# Patient Record
Sex: Female | Born: 1975 | Race: Black or African American | Hispanic: No | Marital: Single | Smoking: Never smoker
Health system: Southern US, Community
[De-identification: ages and names within clinical notes are randomized; demographics above are authoritative.]

---

## 2015-09-26 ENCOUNTER — Encounter (HOSPITAL_BASED_OUTPATIENT_CLINIC_OR_DEPARTMENT_OTHER): Payer: Self-pay | Admitting: Emergency Medicine

## 2015-09-26 ENCOUNTER — Emergency Department (HOSPITAL_BASED_OUTPATIENT_CLINIC_OR_DEPARTMENT_OTHER): Payer: Self-pay

## 2015-09-26 DIAGNOSIS — S92332A Displaced fracture of third metatarsal bone, left foot, initial encounter for closed fracture: Secondary | ICD-10-CM | POA: Insufficient documentation

## 2015-09-26 DIAGNOSIS — Y998 Other external cause status: Secondary | ICD-10-CM | POA: Insufficient documentation

## 2015-09-26 DIAGNOSIS — Y9289 Other specified places as the place of occurrence of the external cause: Secondary | ICD-10-CM | POA: Insufficient documentation

## 2015-09-26 DIAGNOSIS — W108XXA Fall (on) (from) other stairs and steps, initial encounter: Secondary | ICD-10-CM | POA: Insufficient documentation

## 2015-09-26 DIAGNOSIS — Y9389 Activity, other specified: Secondary | ICD-10-CM | POA: Insufficient documentation

## 2015-09-26 NOTE — ED Notes (Signed)
Pt sts she twisted her left foot on a "stepdown" while going to her car.  Pain and swelling to top of left foot.

## 2015-09-27 ENCOUNTER — Emergency Department (HOSPITAL_BASED_OUTPATIENT_CLINIC_OR_DEPARTMENT_OTHER)
Admission: EM | Admit: 2015-09-27 | Discharge: 2015-09-27 | Disposition: A | Discharge status: Home / Self Care | Payer: Self-pay | Attending: Emergency Medicine | Admitting: Emergency Medicine

## 2015-09-27 DIAGNOSIS — S92332A Displaced fracture of third metatarsal bone, left foot, initial encounter for closed fracture: Secondary | ICD-10-CM

## 2015-09-27 DIAGNOSIS — W108XXA Fall (on) (from) other stairs and steps, initial encounter: Secondary | ICD-10-CM

## 2015-09-27 MED ORDER — HYDROCODONE-ACETAMINOPHEN 5-325 MG PO TABS: 1.0000 | ORAL_TABLET | Freq: Four times a day (QID) | ORAL | Status: DC | PRN

## 2015-09-27 MED ORDER — IBUPROFEN 800 MG PO TABS
800.0000 mg | ORAL_TABLET | Freq: Once | ORAL | Status: AC
  Administered 2015-09-27: 800 mg via ORAL
  Filled 2015-09-27: qty 1

## 2015-09-27 MED ORDER — HYDROCODONE-ACETAMINOPHEN 5-325 MG PO TABS
1.0000 | ORAL_TABLET | Freq: Once | ORAL | Status: AC
  Administered 2015-09-27: 1 via ORAL
  Filled 2015-09-27: qty 1

## 2015-09-27 NOTE — ED Provider Notes (Signed)
CSN: [id number]     Arrival date & time 09/26/15  2327 History   First MD Initiated Contact with Patient 09/27/15 0246     Chief Complaint  Patient presents with  . Foot Injury     (Consider location/radiation/quality/duration/timing/severity/associated sxs/prior Treatment) HPI  This is a 40 year old female who missed a step coming out of her house yesterday evening about 10 PM. In the process she twisted her left foot and is now having moderate pain in the mid dorsal left foot with some associated swelling. She is unable to bear weight due to pain. Pain is worse with palpation, movement or attempted weightbearing. She denies left ankle pain or other injury.  History reviewed. No pertinent past medical history. History reviewed. No pertinent past surgical history. No family history on file. Social History  Substance Use Topics  . Smoking status: Never Smoker   . Smokeless tobacco: None  . Alcohol Use: No   OB History    No data available     Review of Systems  All other systems reviewed and are negative.   Allergies  Review of patient's allergies indicates no known allergies.  Home Medications   Prior to Admission medications   Not on File   BP 115/80 mmHg  Pulse 82  Temp(Src) 97.7 F (36.5 C) (Oral)  Resp 16  Ht  (1.676 m)  Wt 210 lb (95.255 kg)  BMI 33.91 kg/m2  SpO2 100%  LMP 08/04/2015 (Approximate)   Physical Exam  General: Well-developed, well-nourished female in no acute distress; appearance consistent with age of record HENT: normocephalic; atraumatic Eyes: Normal appearance Neck: supple Heart: regular rate and rhythm Lungs: clear to auscultation bilaterally Abdomen: soft; nondistended Extremities: No deformity; pulses normal; tenderness and mild swelling over mid dorsal left foot; no left ankle tenderness; no proximal left fibular tenderness Neurologic: Awake, alert and oriented; motor function intact in all extremities and symmetric; no facial  droop Skin: Warm and dry Psychiatric: Normal mood and affect    ED Course  Procedures (including critical care time)   MDM  Nursing notes and vitals signs, including pulse oximetry, reviewed.  Summary of this visit's results, reviewed by myself:  Imaging Studies: Dg Foot Complete Left  09/27/2015  CLINICAL DATA:  Left foot pain after fall. Twisting injury today. Pain and swelling about dorsum of foot. EXAM: LEFT FOOT - COMPLETE 3+ VIEW COMPARISON:  None. FINDINGS: Questionable transverse fracture of the proximal third metatarsal, age indeterminate. The alignment is maintained. No evidence of additional fracture. There is dorsal soft tissue edema. IMPRESSION: Question of transverse third proximal metatarsal fracture, age indeterminate and not well characterized radiographically. CT could be considered for further evaluation. Electronically Signed   By: Rubye Oaks M.D.   On: 09/27/2015 00:34       Paula Libra, MD 09/27/15 [phone]

## 2015-09-27 NOTE — ED Notes (Signed)
Ice to left foot

## 2015-09-27 NOTE — ED Notes (Signed)
Pt tolerated well. Pt did not have any questions regarding shoe or crutches.

## 2015-09-28 ENCOUNTER — Encounter: Payer: Self-pay | Admitting: Family Medicine

## 2015-09-28 ENCOUNTER — Ambulatory Visit (INDEPENDENT_AMBULATORY_CARE_PROVIDER_SITE_OTHER): Payer: Self-pay | Admitting: Family Medicine

## 2015-09-28 VITALS — BP 119/75 | HR 79 | Ht 66.0 in

## 2015-09-28 DIAGNOSIS — S92332A Displaced fracture of third metatarsal bone, left foot, initial encounter for closed fracture: Secondary | ICD-10-CM

## 2015-09-28 MED ORDER — HYDROCODONE-ACETAMINOPHEN 5-325 MG PO TABS: 1.0000 | ORAL_TABLET | Freq: Four times a day (QID) | ORAL | Status: DC | PRN

## 2015-09-28 MED FILL — HYDROCODON-APAP 5-325: 5-325 | 15 days supply | Qty: 60 | Fill #0

## 2015-09-28 NOTE — Patient Instructions (Signed)
You have a 3rd metatarsal fracture. Use the cam walker or postop shoe when up and walking around. Icing 15 minutes at a time 3-4 times a day. Elevate above your heart as much as possible. Norco as needed for severe pain. Ok to take ibuprofen or aleve in addition to this. Follow up with me in 2 weeks - we will repeat your x-rays at this time.

## 2015-10-01 DIAGNOSIS — S92332A Displaced fracture of third metatarsal bone, left foot, initial encounter for closed fracture: Secondary | ICD-10-CM | POA: Insufficient documentation

## 2015-10-01 NOTE — Progress Notes (Signed)
PCP: No PCP Per Patient  Subjective:   HPI: Patient is a 40 y.o. female here for left foot injury.  Patient reports on 2/25 she fell down some steps and twisted her foot. Difficulty weight bearing. Pain is sharp, associated with swelling - 9/10 level of pain dorsal left foot. Using crutches and not weight bearing. Using a postop shoe. No prior injuries. Has been icing, elevating. No skin changes, fever, other complaints.  No past medical history on file.  No current outpatient prescriptions on file prior to visit.   No current facility-administered medications on file prior to visit.    No past surgical history on file.  No Known Allergies  Social History   Social History  . Marital Status: Single    Spouse Name: N/A  . Number of Children: N/A  . Years of Education: N/A   Occupational History  . Not on file.   Social History Main Topics  . Smoking status: Never Smoker   . Smokeless tobacco: Not on file  . Alcohol Use: No  . Drug Use: No  . Sexual Activity: Yes    Birth Control/ Protection: None   Other Topics Concern  . Not on file   Social History Narrative    No family history on file.  BP 119/75 mmHg  Pulse 79  Ht  (1.676 m)  LMP 08/04/2015 (Approximate)  Review of Systems: See HPI above.    Objective:  Physical Exam:  Gen: NAD, comfortable in exam room  Left foot/ankle: Mod swelling, no bruising dorsal foot.  No other deformity. FROM ankle with 5/5 strength all directions. TTP greatest over 3rd metatarsal, less surrounding this. Negative ant drawer and talar tilt.   Negative syndesmotic compression. Positive metatarsal squeeze. Thompsons test negative. NV intact distally.  Right foot/ankle: FROM without pain.  MSK u/s left foot - cortical irregularity of 3rd metatarsal with edema overlying cortex.  No other abnormalities.    Assessment & Plan:  1. Left 3rd metatarsal fracture - independently reviewed radiographs and today's  ultrasound.  Consistent with transverse 3rd metatarsal fracture.  Expect 6-8 weeks to fully recover.  Icing, elevation, nsaids with norco as needed.  F/u in 2 weeks to repeat ultrasound.  Cam walker with crutches for support.

## 2015-10-01 NOTE — Assessment & Plan Note (Signed)
Left 3rd metatarsal fracture - independently reviewed radiographs and today's ultrasound.  Consistent with transverse 3rd metatarsal fracture.  Expect 6-8 weeks to fully recover.  Icing, elevation, nsaids with norco as needed.  F/u in 2 weeks to repeat ultrasound.  Cam walker with crutches for support.

## 2015-10-12 ENCOUNTER — Ambulatory Visit (INDEPENDENT_AMBULATORY_CARE_PROVIDER_SITE_OTHER): Payer: Self-pay | Admitting: Family Medicine

## 2015-10-12 ENCOUNTER — Ambulatory Visit (HOSPITAL_BASED_OUTPATIENT_CLINIC_OR_DEPARTMENT_OTHER)
Admission: RE | Admit: 2015-10-12 | Discharge: 2015-10-12 | Disposition: A | Discharge status: Home / Self Care | Payer: Self-pay | Source: Ambulatory Visit | Attending: Family Medicine | Admitting: Family Medicine

## 2015-10-12 VITALS — BP 116/80 | HR 90 | Ht 66.0 in | Wt 210.0 lb

## 2015-10-12 DIAGNOSIS — S92302D Fracture of unspecified metatarsal bone(s), left foot, subsequent encounter for fracture with routine healing: Secondary | ICD-10-CM

## 2015-10-12 DIAGNOSIS — S92332D Displaced fracture of third metatarsal bone, left foot, subsequent encounter for fracture with routine healing: Secondary | ICD-10-CM

## 2015-10-12 DIAGNOSIS — X58XXXD Exposure to other specified factors, subsequent encounter: Secondary | ICD-10-CM | POA: Insufficient documentation

## 2015-10-12 MED ORDER — HYDROCODONE-ACETAMINOPHEN 5-325 MG PO TABS: 1.0000 | ORAL_TABLET | Freq: Four times a day (QID) | ORAL | Status: DC | PRN

## 2015-10-12 NOTE — Patient Instructions (Addendum)
You have a 3rd metatarsal fracture. Use the cam walker or postop shoe. Use crutches and try not to put weight on this leg at all. Icing 15 minutes at a time 3-4 times a day. Elevate above your heart as much as possible. Norco as needed for severe pain. Ok to take ibuprofen or aleve in addition to this. Follow up with me in 2 weeks - we will repeat your x-rays at this time.

## 2015-10-14 ENCOUNTER — Encounter: Payer: Self-pay | Admitting: Family Medicine

## 2015-10-14 NOTE — Assessment & Plan Note (Signed)
Independently reviewed radiographs along with today's ultrasound.  On ultrasound only the 3rd metatarsal fracture is visible.  On radiographs she appears to have fractures at bases of 2nd and 4th metatarsals.  I advised patient at this point to continue wearing boot but switch to non weight bearing with concern for lis franc injury.  She has not yet obtained [facility] Coverage but has paperwork to do so. We will consider MRI once she has this and will follow her clinical healing in meantime.  Icing, norco, nsaids.  F/u in 2 weeks.

## 2015-10-14 NOTE — Progress Notes (Signed)
PCP: No PCP Per Patient  Subjective:   HPI: Patient is a 40 y.o. female here for left foot injury.  2/27: Patient reports on 2/25 she fell down some steps and twisted her foot. Difficulty weight bearing. Pain is sharp, associated with swelling - 9/10 level of pain dorsal left foot. Using crutches and not weight bearing. Using a postop shoe. No prior injuries. Has been icing, elevating. No skin changes, fever, other complaints.  3/13: Patient reports her pain level is down to 5/10, sharp. Still with swelling. Wearing cam walker - tried walking at times without the boot around the house with pain. Icing, elevating. No skin changes, fever.  No past medical history on file.  No current outpatient prescriptions on file prior to visit.   No current facility-administered medications on file prior to visit.    No past surgical history on file.  No Known Allergies  Social History   Social History  . Marital Status: Single    Spouse Name: N/A  . Number of Children: N/A  . Years of Education: N/A   Occupational History  . Not on file.   Social History Main Topics  . Smoking status: Never Smoker   . Smokeless tobacco: Not on file  . Alcohol Use: No  . Drug Use: No  . Sexual Activity: Yes    Birth Control/ Protection: None   Other Topics Concern  . Not on file   Social History Narrative    No family history on file.  BP 116/80 mmHg  Pulse 90  Ht 5\' 6"  (1.676 m)  Wt 210 lb (95.255 kg)  BMI 33.91 kg/m2  LMP 08/08/2015  Review of Systems: See HPI above.    Objective:  Physical Exam:  Gen: NAD, comfortable in exam room  Left foot/ankle: Mild swelling, no bruising dorsal foot.  No other deformity. FROM ankle with 5/5 strength all directions. TTP greatest over 3rd metatarsal, less surrounding this. Negative ant drawer and talar tilt.   Negative syndesmotic compression. Positive metatarsal squeeze. Thompsons test negative. NV intact distally.  Right  foot/ankle: FROM without pain.  MSK u/s left foot - cortical irregularity of 3rd metatarsal with edema overlying cortex.  No other abnormalities.    Assessment & Plan:  1. Metatarsal fractures - Independently reviewed radiographs along with today's ultrasound.  On ultrasound only the 3rd metatarsal fracture is visible.  On radiographs she appears to have fractures at bases of 2nd and 4th metatarsals.  I advised patient at this point to continue wearing boot but switch to non weight bearing with concern for lis franc injury.  She has not yet obtained [facility] Coverage but has paperwork to do so. We will consider MRI once she has this and will follow her clinical healing in meantime.  Icing, norco, nsaids.  F/u in 2 weeks.

## 2015-10-26 ENCOUNTER — Ambulatory Visit (HOSPITAL_BASED_OUTPATIENT_CLINIC_OR_DEPARTMENT_OTHER)
Admission: RE | Admit: 2015-10-26 | Discharge: 2015-10-26 | Disposition: A | Discharge status: Home / Self Care | Payer: Self-pay | Source: Ambulatory Visit | Attending: Family Medicine | Admitting: Family Medicine

## 2015-10-26 ENCOUNTER — Ambulatory Visit (INDEPENDENT_AMBULATORY_CARE_PROVIDER_SITE_OTHER): Payer: Self-pay | Admitting: Family Medicine

## 2015-10-26 ENCOUNTER — Encounter: Payer: Self-pay | Admitting: Family Medicine

## 2015-10-26 VITALS — BP 108/73 | HR 87 | Ht 66.0 in | Wt 200.0 lb

## 2015-10-26 DIAGNOSIS — M79672 Pain in left foot: Secondary | ICD-10-CM

## 2015-10-26 DIAGNOSIS — S92332D Displaced fracture of third metatarsal bone, left foot, subsequent encounter for fracture with routine healing: Secondary | ICD-10-CM

## 2015-10-26 NOTE — Patient Instructions (Signed)
Try not to put weight on this leg and wear the boot every time you're up and around. Can take this off to ice it, wash the area. Icing 15 minutes at a time 3-4 times a day. Elevate above your heart as much as possible. Norco as needed for severe pain. Ok to take ibuprofen or aleve in addition to this. Follow up with me in 2 weeks - we will repeat your x-rays at this time. If you're not improving and we do not see callus at this visit we need to do further imaging.

## 2015-10-26 NOTE — Progress Notes (Signed)
PCP: No PCP Per Patient  Subjective:   HPI: Patient is a 40 y.o. female here for left foot injury.  2/27: Patient reports on 2/25 she fell down some steps and twisted her foot. Difficulty weight bearing. Pain is sharp, associated with swelling - 9/10 level of pain dorsal left foot. Using crutches and not weight bearing. Using a postop shoe. No prior injuries. Has been icing, elevating. No skin changes, fever, other complaints.  3/13: Patient reports her pain level is down to 5/10, sharp. Still with swelling. Wearing cam walker - tried walking at times without the boot around the house with pain. Icing, elevating. No skin changes, fever.  3/27: Patient reports mild improvement from last visit. Pain level 4/10, sharp dorsal left foot. Has unfortunately been walking in boot - tried to walk once without the boot but was painful. Swelling improved. Has been icing, elevating. No skin changes, fever.  No past medical history on file.  Current Outpatient Prescriptions on File Prior to Visit  Medication Sig Dispense Refill  . HYDROcodone-acetaminophen (NORCO) 5-325 MG tablet Take 1 tablet by mouth every 6 (six) hours as needed (for pain). 60 tablet 0   No current facility-administered medications on file prior to visit.    No past surgical history on file.  No Known Allergies  Social History   Social History  . Marital Status: Single    Spouse Name: N/A  . Number of Children: N/A  . Years of Education: N/A   Occupational History  . Not on file.   Social History Main Topics  . Smoking status: Never Smoker   . Smokeless tobacco: Not on file  . Alcohol Use: No  . Drug Use: No  . Sexual Activity: Yes    Birth Control/ Protection: None   Other Topics Concern  . Not on file   Social History Narrative    No family history on file.  BP 108/73 mmHg  Pulse 87  Ht 5\' 6"  (1.676 m)  Wt 200 lb (90.719 kg)  BMI 32.30 kg/m2  LMP 08/08/2015  Review of  Systems: See HPI above.    Objective:  Physical Exam:  Gen: NAD, comfortable in exam room  Left foot/ankle: Mild swelling, no bruising dorsal foot.  No other deformity. FROM ankle with 5/5 strength all directions. TTP greatest over 3rd metatarsal and 4th metatarsal only.  No 2nd metatarsal, other tenderness. Negative ant drawer and talar tilt.   Negative syndesmotic compression. Thompsons test negative. NV intact distally.  Right foot/ankle: FROM without pain.    Assessment & Plan:  1. Metatarsal fractures - Independently reviewed today's radiographs - no callus formation yet though they appear to be healing and patient is clinically improving.  Stressed importance of being non weight bearing given concern for lis franc injury.  She does not yet have [facility] Coverage but states she has provided paperwork for this.  Continue non weight bearing, boot, crutches.  Icing, norco if needed.  F/u in 2 weeks to repeat x-rays.  Consider advanced imaging if not improving as expected at that point.

## 2015-10-26 NOTE — Assessment & Plan Note (Signed)
Independently reviewed today's radiographs - no callus formation yet though they appear to be healing and patient is clinically improving.  Stressed importance of being non weight bearing given concern for lis franc injury.  She does not yet have [facility] Coverage but states she has provided paperwork for this.  Continue non weight bearing, boot, crutches.  Icing, norco if needed.  F/u in 2 weeks to repeat x-rays.  Consider advanced imaging if not improving as expected at that point.

## 2015-11-09 ENCOUNTER — Ambulatory Visit: Payer: Self-pay | Admitting: Family Medicine

## 2015-11-10 ENCOUNTER — Ambulatory Visit: Payer: Self-pay | Admitting: Family Medicine

## 2015-12-22 ENCOUNTER — Emergency Department (HOSPITAL_BASED_OUTPATIENT_CLINIC_OR_DEPARTMENT_OTHER): Payer: Self-pay

## 2015-12-22 ENCOUNTER — Emergency Department (HOSPITAL_BASED_OUTPATIENT_CLINIC_OR_DEPARTMENT_OTHER)
Admission: EM | Admit: 2015-12-22 | Discharge: 2015-12-22 | Disposition: A | Discharge status: Home / Self Care | Payer: Self-pay | Attending: Emergency Medicine | Admitting: Emergency Medicine

## 2015-12-22 ENCOUNTER — Encounter (HOSPITAL_BASED_OUTPATIENT_CLINIC_OR_DEPARTMENT_OTHER): Payer: Self-pay | Admitting: *Deleted

## 2015-12-22 DIAGNOSIS — M79672 Pain in left foot: Secondary | ICD-10-CM | POA: Insufficient documentation

## 2015-12-22 NOTE — Discharge Instructions (Signed)
Ice and elevate affected area for relief of pain and swelling. Ibuprofen as needed for pain. If symptoms persist greater than 1 week, follow up with the orthopedic clinic listed. Return to ER for any new or worsening symptoms, any additional concerns.  COLD THERAPY DIRECTIONS:  Ice or gel packs can be used to reduce both pain and swelling. Ice is the most helpful within the first 24 to 48 hours after an injury or flareup from overusing a muscle or joint.  Ice is effective, has very few side effects, and is safe for most people to use.   If you expose your skin to cold temperatures for too long or without the proper protection, you can damage your skin or nerves. Watch for signs of skin damage due to cold.   HOME CARE INSTRUCTIONS  Follow these tips to use ice and cold packs safely.  Place a dry or damp towel between the ice and skin. A damp towel will cool the skin more quickly, so you may need to shorten the time that the ice is used.  For a more rapid response, add gentle compression to the ice.  Ice for no more than 10 to 20 minutes at a time. The bonier the area you are icing, the less time it will take to get the benefits of ice.  Check your skin after 5 minutes to make sure there are no signs of a poor response to cold or skin damage.  Rest 20 minutes or more in between uses.  Once your skin is numb, you can end your treatment. You can test numbness by very lightly touching your skin. The touch should be so light that you do not see the skin dimple from the pressure of your fingertip. When using ice, most people will feel these normal sensations in this order: cold, burning, aching, and numbness.

## 2015-12-22 NOTE — ED Provider Notes (Signed)
CSN: [id number]     Arrival date & time 12/22/15  1738 History   First MD Initiated Contact with Patient 12/22/15 1800     Chief Complaint  Patient presents with  . Foot Pain    (Consider location/radiation/quality/duration/timing/severity/associated sxs/prior Treatment) Patient is a 40 y.o. female presenting with lower extremity pain. The history is provided by the patient and medical records. No language interpreter was used.  Foot Pain Associated symptoms include arthralgias and joint swelling.  [facility] is a 40 y.o. female  who presents to the Emergency Department complaining of aching non-radiating left foot pain since this morning. She states that she broke her third and fourth metatarsals in February-everything healed as expected. She awoke late this morning and jumped out of the bed suddenly, putting all of her weight on her left foot. She had an acute onset of left foot pain at this time and her foot has been aching ever since. Mild associated swelling. No medications or treatments prior to arrival. Pain is worse with ambulation and palpation.  History reviewed. No pertinent past medical history. History reviewed. No pertinent past surgical history. No family history on file. Social History  Substance Use Topics  . Smoking status: Never Smoker   . Smokeless tobacco: None  . Alcohol Use: No   OB History    No data available     Review of Systems  Musculoskeletal: Positive for joint swelling and arthralgias.  Skin: Negative for color change.      Allergies  Review of patient's allergies indicates no known allergies.  Home Medications   Prior to Admission medications   Medication Sig Start Date End Date Taking? Authorizing Provider  HYDROcodone-acetaminophen (NORCO) 5-325 MG tablet Take 1 tablet by mouth every 6 (six) hours as needed (for pain). 10/12/15   Lenda Kelp, MD   BP 112/80 mmHg  Pulse 65  Temp(Src) 98.1 F (36.7 C) (Oral)  Resp 16  Ht   (1.676 m)  Wt 81.647 kg  BMI 29.07 kg/m2  SpO2 97%  LMP 12/14/2015 Physical Exam  Constitutional: She is oriented to person, place, and time. She appears well-developed and well-nourished.  Alert and in no acute distress  HENT:  Head: Normocephalic and atraumatic.  Cardiovascular: Normal rate, regular rhythm, normal heart sounds and intact distal pulses.   Pulmonary/Chest: Effort normal and breath sounds normal. No respiratory distress.  Musculoskeletal:       Feet:  Left Foot/Ankle: No gross deformity noted. + mild swelling on dorsum of foot. Full ROM. No erythema, or warmth overlaying the joint. There is tenderness to palpation as depicted in image. 2+ DP pulses, sensation intact to medial, lateral, dorsal and plantar aspects.  Neurological: She is alert and oriented to person, place, and time.  Skin: Skin is warm and dry.  Psychiatric: She has a normal mood and affect.  Nursing note and vitals reviewed.   ED Course  Procedures (including critical care time) Labs Review Labs Reviewed - No data to display  Imaging Review Dg Foot Complete Left  12/22/2015  CLINICAL DATA:  Stepped on left foot wrong with persistent pain, initial encounter EXAM: LEFT FOOT - COMPLETE 3+ VIEW COMPARISON:  10/26/2015 FINDINGS: The previously seen fractures of the second through fourth metatarsals now show evidence of healing. No acute fracture is identified. No gross soft tissue abnormality is seen. IMPRESSION: Healed fractures of the second through fourth metatarsals. No [id number] is noted. Electronically Signed   By: Eulah Pont.D.  On: 12/22/2015 18:18   I have personally reviewed and evaluated these images and lab results as part of my medical decision-making.   EKG Interpretation None      MDM   Final diagnoses:  Left foot pain   [facility]. Hx of fractured metatarsals in this foot 3 months ago. Area of pain is the same. X-ray was obtained  which shows healed fractures of the second through fourth metatarsals. No acute abnormalities were noted. Extremity is neurovascularly intact. Patient states she has a cam walker boot and crutches at home. Encouraged her to use these for the next 3 days. Follow-up with orthopedics if symptoms do not improve after this. Return precautions given all questions answered.  [facility]ime Pilcher Chanita Boden, PA-C 12/22/15 2250  Alvira MondayErin Schlossman, MD 12/23/15 [id number]

## 2015-12-22 NOTE — ED Notes (Signed)
Left foot pain. States she had a broken bone in same foot in February. She jumped out of the bed suddenly and has had pain in her foot since.

## 2016-03-31 ENCOUNTER — Emergency Department (HOSPITAL_BASED_OUTPATIENT_CLINIC_OR_DEPARTMENT_OTHER)
Admission: EM | Admit: 2016-03-31 | Discharge: 2016-03-31 | Disposition: A | Discharge status: Home / Self Care | Payer: Self-pay | Attending: Emergency Medicine | Admitting: Emergency Medicine

## 2016-03-31 ENCOUNTER — Encounter (HOSPITAL_BASED_OUTPATIENT_CLINIC_OR_DEPARTMENT_OTHER): Payer: Self-pay

## 2016-03-31 ENCOUNTER — Emergency Department (HOSPITAL_BASED_OUTPATIENT_CLINIC_OR_DEPARTMENT_OTHER): Payer: Self-pay

## 2016-03-31 DIAGNOSIS — Y939 Activity, unspecified: Secondary | ICD-10-CM | POA: Insufficient documentation

## 2016-03-31 DIAGNOSIS — Y929 Unspecified place or not applicable: Secondary | ICD-10-CM | POA: Insufficient documentation

## 2016-03-31 DIAGNOSIS — S4992XA Unspecified injury of left shoulder and upper arm, initial encounter: Secondary | ICD-10-CM | POA: Insufficient documentation

## 2016-03-31 DIAGNOSIS — Y999 Unspecified external cause status: Secondary | ICD-10-CM | POA: Insufficient documentation

## 2016-03-31 DIAGNOSIS — W19XXXA Unspecified fall, initial encounter: Secondary | ICD-10-CM | POA: Insufficient documentation

## 2016-03-31 MED ORDER — HYDROCODONE-ACETAMINOPHEN 5-325 MG PO TABS: 1.0000 | ORAL_TABLET | Freq: Four times a day (QID) | ORAL | 0 refills | Status: AC | PRN

## 2016-03-31 MED ORDER — METHOCARBAMOL 500 MG PO TABS: 500.0000 mg | ORAL_TABLET | Freq: Two times a day (BID) | ORAL | 0 refills | Status: AC

## 2016-03-31 MED ORDER — NAPROXEN 500 MG PO TABS: 500.0000 mg | ORAL_TABLET | Freq: Two times a day (BID) | ORAL | 0 refills | Status: AC

## 2016-03-31 NOTE — ED Provider Notes (Signed)
[facility]-EMERGENCY DEPT Provider Note   CSN: [id number] Arrival date & time: 03/31/16  1801  By signing my name below, I, Vista Mink, attest that this documentation has been prepared under the direction and in the presence of Rital Cavey PA-C.  Electronically Signed: Vista Mink, ED Scribe. 03/31/16. 7:39 PM.  History   Chief Complaint Chief Complaint  Patient presents with  . Shoulder Injury    HPI HPI Comments: [facility] is a 40 y.o. female who presents to the Emergency Department sudden onset left shoulder pain s/p an injury that occurred 6 days ago. Pt states that she had a mechanical fall and landed directly onto her left shoulder. Pain is mild to moderate, aching/throbbing, nonradiating. Pt reports exacerbating pain when rotating the arm. Pt is able to raise the extremity above her head without difficulty. Denies neuro deficits, LOC, head trauma, or any other injuries or complaints.   The history is provided by the patient. No language interpreter was used.    History reviewed. No pertinent past medical history.  Patient Active Problem List   Diagnosis Date Noted  . Closed fracture of third metatarsal bone of left foot 10/01/2015    History reviewed. No pertinent surgical history.  OB History    No data available       Home Medications    Prior to Admission medications   Medication Sig Start Date End Date Taking? Authorizing Provider  HYDROcodone-acetaminophen (NORCO/VICODIN) 5-325 MG tablet Take 1 tablet by mouth every 6 (six) hours as needed. 03/31/16   Brylee Mcgreal C Hila Bolding, PA-C  methocarbamol (ROBAXIN) 500 MG tablet Take 1 tablet (500 mg total) by mouth 2 (two) times daily. 03/31/16   Lacresha Fusilier C Authur Cubit, PA-C  naproxen (NAPROSYN) 500 MG tablet Take 1 tablet (500 mg total) by mouth 2 (two) times daily. 03/31/16   Anselm Pancoast, PA-C    Family History No family history on file.  Social History Social History  Substance Use Topics  . Smoking status: Never Smoker  . Smokeless  tobacco: Never Used  . Alcohol use No     Allergies   Review of patient's allergies indicates no known allergies.   Review of Systems Review of Systems  Musculoskeletal: Positive for arthralgias (left anterior shoulder).  Neurological: Negative for weakness and numbness.  All other systems reviewed and are negative.    Physical Exam Updated Vital Signs BP 115/71 (BP Location: Right Arm)   Pulse 71   Temp 98.3 F (36.8 C) (Oral)   Resp 18   Ht 5\' 6"  (1.676 m)   Wt 175 lb (79.4 kg)   LMP 03/16/2016   SpO2 100%   BMI 28.25 kg/m   Physical Exam  Constitutional: She is oriented to person, place, and time. She appears well-developed and well-nourished. No distress.  HENT:  Head: Normocephalic and atraumatic.  Eyes: Conjunctivae and EOM are normal. Pupils are equal, round, and reactive to light.  Cardiovascular: Normal rate, regular rhythm, normal heart sounds and intact distal pulses.  Exam reveals no gallop and no friction rub.   No murmur heard. Pulmonary/Chest: Effort normal and breath sounds normal. No respiratory distress. She has no wheezes. She has no rales. She exhibits no tenderness.  Abdominal: There is no guarding.  Musculoskeletal: Normal range of motion. She exhibits tenderness. She exhibits no edema.  Tenderness to left anterior shoulder. No discernable crepitus or deformity. Full ROM without deficit noted. Full ROM in all other extremities and spine. No midline spinal tenderness.  Neurological: She is alert and oriented to person, place, and time.  No sensory deficits. Strength 5/5 in all extremities. No gait disturbance. Coordination intact.   Skin: Skin is warm and dry. No rash noted. She is not diaphoretic. No erythema. No pallor.  Psychiatric: She has a normal mood and affect. Her behavior is normal.  Nursing note and vitals reviewed.    ED Treatments / Results  DIAGNOSTIC STUDIES: Oxygen Saturation is 100% on RA, normal by my  interpretation.  COORDINATION OF CARE: 7:36 PM-Will order medication for pain. Discussed treatment plan with pt at bedside and pt agreed to plan.   Labs (all labs ordered are listed, but only abnormal results are displayed) Labs Reviewed - No data to display  EKG  EKG Interpretation None       Radiology Dg Shoulder Left  Result Date: 03/31/2016 CLINICAL DATA:  Larey SeatFell on LEFT shoulder 5 days ago. Pain and swelling. EXAM: LEFT SHOULDER - 2+ VIEW COMPARISON:  None. FINDINGS: The humeral head is well-formed and located. Os acromiale. The subacromial, glenohumeral and acromioclavicular joint spaces are intact. No destructive bony lesions. Soft tissue planes are non-suspicious. IMPRESSION: Negative. Electronically Signed   By: Awilda Metroourtnay  Bloomer M.D.   On: 03/31/2016 18:24    Procedures Procedures (including critical care time)  Medications Ordered in ED Medications - No data to display   Initial Impression / Assessment and Plan / ED Course  I have reviewed the triage vital signs and the nursing notes.  Pertinent labs & imaging results that were available during my care of the patient were reviewed by me and considered in my medical decision making (see chart for details).  Clinical Course    [facility] pain following a fall 6 days ago. Patient has no neuro or functional deficits. Patient placed in a sling and advised to follow-up with orthopedics. The patient was given instructions for home care as well as return precautions. Patient voices understanding of these instructions, accepts the plan, and is comfortable with discharge.  Vitals:   03/31/16 1819 03/31/16 1821 03/31/16 2008  BP: 115/71  113/71  Pulse: 71  66  Resp: 18  16  Temp: 98.3 F (36.8 C)    TempSrc: Oral    SpO2: 100%  100%  Weight:  79.4 kg   Height:  5\' 6"  (1.676 m)      Final Clinical Impressions(s) / ED Diagnoses   Final diagnoses:  Shoulder injury, left, initial  encounter    New Prescriptions Discharge Medication List as of 03/31/2016  7:58 PM    START taking these medications   Details  HYDROcodone-acetaminophen (NORCO/VICODIN) 5-325 MG tablet Take 1 tablet by mouth every 6 (six) hours as needed., Starting Thu 03/31/2016, Print    methocarbamol (ROBAXIN) 500 MG tablet Take 1 tablet (500 mg total) by mouth 2 (two) times daily., Starting Thu 03/31/2016, Print    naproxen (NAPROSYN) 500 MG tablet Take 1 tablet (500 mg total) by mouth 2 (two) times daily., Starting Thu 03/31/2016, Print      I personally performed the services described in this documentation, which was scribed in my presence. The recorded information has been reviewed and is accurate.    Anselm PancoastShawn C Farra Nikolic, PA-C 04/01/16 1617    Shaune Pollackameron Isaacs, MD 04/01/16 [phone]

## 2016-03-31 NOTE — Discharge Instructions (Signed)
Expect your soreness to increase over the next 2-3 days. Take it easy, but do not lay around too much as this may make the stiffness worse. Take 500 mg of naproxen every 12 hours or 800 mg of ibuprofen every 8 hours for the next 3 days. Take these medications with food to avoid upset stomach. Robaxin is a muscle relaxer and may help loosen stiff muscles. Vicodin for severe pain. Do not take the Robaxin or Vicodin while driving or performing other dangerous activities. °

## 2016-03-31 NOTE — ED Triage Notes (Signed)
Larey SeatFell Saturday-pain to left shoulder-NAD-steady gait

## 2016-04-03 ENCOUNTER — Emergency Department (HOSPITAL_BASED_OUTPATIENT_CLINIC_OR_DEPARTMENT_OTHER)
Admission: EM | Admit: 2016-04-03 | Discharge: 2016-04-03 | Disposition: A | Discharge status: Home / Self Care | Payer: Self-pay | Attending: Emergency Medicine | Admitting: Emergency Medicine

## 2016-04-03 ENCOUNTER — Encounter (HOSPITAL_BASED_OUTPATIENT_CLINIC_OR_DEPARTMENT_OTHER): Payer: Self-pay | Admitting: Emergency Medicine

## 2016-04-03 DIAGNOSIS — N750 Cyst of Bartholin's gland: Secondary | ICD-10-CM | POA: Insufficient documentation

## 2016-04-03 MED ORDER — SULFAMETHOXAZOLE-TRIMETHOPRIM 800-160 MG PO TABS: 1.0000 | ORAL_TABLET | Freq: Two times a day (BID) | ORAL | 0 refills | Status: AC

## 2016-04-03 NOTE — Discharge Instructions (Signed)
Please read and follow all provided instructions.  Your diagnoses today include:  1. Cyst of Bartholin's gland     Tests performed today include:  Vital signs. See below for your results today.   Medications prescribed:   Bactrim (trimethoprim/sulfamethoxazole) - antibiotic  You have been prescribed an antibiotic medicine: take the entire course of medicine even if you are feeling better. Stopping early can cause the antibiotic not to work.   Take any prescribed medications only as directed.   Home care instructions:   Follow any educational materials contained in this packet  Perform warm soaks for 15 minutes at least twice a day until the area is feeling better.   Follow-up instructions: Return to the Emergency Department in 48 hours for a recheck if your symptoms are not significantly improved.  Return instructions:  Return to the Emergency Department if you have:  Fever  Worsening symptoms  Worsening pain  Worsening swelling  Redness of the skin that moves away from the affected area, especially if it streaks away from the affected area   Any other emergent concerns   Your vital signs today were: BP 116/87 (BP Location: Right Arm)    Pulse 85    Temp 98.7 F (37.1 C) (Oral)    Resp 18    Ht 5\' 6"  (1.676 m)    Wt 79.4 kg    LMP 03/16/2016 (Exact Date)    SpO2 100%    BMI 28.25 kg/m  If your blood pressure (BP) was elevated above 135/85 this visit, please have this repeated by your doctor within one month. --------------

## 2016-04-03 NOTE — ED Provider Notes (Signed)
[facility]   CSN: [id number] Arrival date & time: 04/03/16  1316     History   Chief Complaint Chief Complaint  Patient presents with  . Abscess    HPI Sheila Gillespie is a 40 y.o. female.  Patient presents with complaint of a tender swelling area on her right labia. Symptoms started 2 days ago. Patient noted swelling and tenderness. She attempted to squeeze the area and expressed some yellow brown purulent fluid. She denies any fevers. No vaginal discharge. She is currently on her menstrual period. No other symptoms of illness. Patient has had boils in the past but none in this area. The onset of this condition was acute. The course is constant. Aggravating factors: none. Alleviating factors: none.        History reviewed. No pertinent past medical history.  Patient Active Problem List   Diagnosis Date Noted  . Closed fracture of third metatarsal bone of left foot 10/01/2015    History reviewed. No pertinent surgical history.  OB History    No data available       Home Medications    Prior to Admission medications   Medication Sig Start Date End Date Taking? Authorizing Provider  HYDROcodone-acetaminophen (NORCO/VICODIN) 5-325 MG tablet Take 1 tablet by mouth every 6 (six) hours as needed. 03/31/16   Shawn C Joy, PA-C  methocarbamol (ROBAXIN) 500 MG tablet Take 1 tablet (500 mg total) by mouth 2 (two) times daily. 03/31/16   Shawn C Joy, PA-C  naproxen (NAPROSYN) 500 MG tablet Take 1 tablet (500 mg total) by mouth 2 (two) times daily. 03/31/16   Shawn C Joy, PA-C  sulfamethoxazole-trimethoprim (BACTRIM DS,SEPTRA DS) 800-160 MG tablet Take 1 tablet by mouth 2 (two) times daily. 04/03/16 04/10/16  Renne Crigler, PA-C    Family History History reviewed. No pertinent family history.  Social History Social History  Substance Use Topics  . Smoking status: Never Smoker  . Smokeless tobacco: Never Used  . Alcohol use No     Allergies   Review  of patient's allergies indicates no known allergies.   Review of Systems Review of Systems  Constitutional: Negative for fever.  Gastrointestinal: Negative for nausea and vomiting.  Genitourinary: Positive for pelvic pain.  Skin: Negative for color change.       Positive for abscess.  Hematological: Negative for adenopathy.     Physical Exam Updated Vital Signs BP 116/87 (BP Location: Right Arm)   Pulse 85   Temp 98.7 F (37.1 C) (Oral)   Resp 18   Ht 5\' 6"  (1.676 m)   Wt 79.4 kg   LMP 03/16/2016 (Exact Date)   SpO2 100%   BMI 28.25 kg/m   Physical Exam  Constitutional: She appears well-developed and well-nourished.  HENT:  Head: Normocephalic and atraumatic.  Eyes: Conjunctivae are normal.  Neck: Normal range of motion. Neck supple.  Pulmonary/Chest: No respiratory distress.  Genitourinary:     Neurological: She is alert.  Skin: Skin is warm and dry.  Psychiatric: She has a normal mood and affect.  Nursing note and vitals reviewed.    ED Treatments / Results   Procedures Procedures (including critical care time)  Initial Impression / Assessment and Plan / ED Course  I have reviewed the triage vital signs and the nursing notes.  Pertinent labs & imaging results that were available during my care of the patient were reviewed by me and considered in my medical decision making (see chart for details).  Clinical Course   Patient seen and examined. Exam performed with Lavella LemonsKaylee Perez PA-S2 at bedside. Discussed that there is currently no drainable fluid collection. We discussed sitz baths and antibiotics. She will take pain medication prescribed at previous visit as needed for more severe tenderness or pain. Encouraged GYN or ED return if area becomes more swollen, if she develops fever, new symptoms or other concerns. Patient verbalizes understanding and agrees with the plan.  Final Clinical Impressions(s) / ED Diagnoses   Final diagnoses:  Cyst of Bartholin's  gland   Patient likely with developing Bartholin's gland cyst, drained at home per her report yesterday. Area is overall minor in appearance without any significant soft tissue infection associated. There is currently no induration or areas of fluctuance that would be amenable to incision and drainage. Treatment and follow-up as above.  New Prescriptions New Prescriptions   SULFAMETHOXAZOLE-TRIMETHOPRIM (BACTRIM DS,SEPTRA DS) 800-160 MG TABLET    Take 1 tablet by mouth 2 (two) times daily.     Renne CriglerJoshua Careli Luzader, PA-C 04/03/16 1427    Eber HongBrian Miller, MD 04/04/16 [phone]

## 2016-04-03 NOTE — ED Triage Notes (Signed)
Patient states that she has a boil to her "private area"

## 2018-10-11 ENCOUNTER — Encounter (HOSPITAL_BASED_OUTPATIENT_CLINIC_OR_DEPARTMENT_OTHER): Payer: Self-pay | Admitting: Emergency Medicine

## 2018-10-11 ENCOUNTER — Other Ambulatory Visit: Payer: Self-pay

## 2018-10-11 DIAGNOSIS — J029 Acute pharyngitis, unspecified: Secondary | ICD-10-CM | POA: Insufficient documentation

## 2018-10-11 LAB — GROUP A STREP BY PCR: Group A Strep by PCR: NOT DETECTED

## 2018-10-11 NOTE — ED Triage Notes (Signed)
Pt having sore throat for a week getting worse today.

## 2018-10-12 ENCOUNTER — Emergency Department (HOSPITAL_BASED_OUTPATIENT_CLINIC_OR_DEPARTMENT_OTHER)
Admission: EM | Admit: 2018-10-12 | Discharge: 2018-10-12 | Disposition: A | Discharge status: Home / Self Care | Payer: Self-pay | Attending: Emergency Medicine | Admitting: Emergency Medicine

## 2018-10-12 DIAGNOSIS — J029 Acute pharyngitis, unspecified: Secondary | ICD-10-CM

## 2018-10-12 MED ORDER — AMOXICILLIN-POT CLAVULANATE 875-125 MG PO TABS: 1.0000 | ORAL_TABLET | Freq: Two times a day (BID) | ORAL | 0 refills | Status: AC

## 2018-10-12 MED ORDER — PREDNISONE 20 MG PO TABS: 40.0000 mg | ORAL_TABLET | Freq: Every day | ORAL | 0 refills | Status: AC

## 2018-10-12 MED ORDER — LORATADINE 10 MG PO TABS: 10.0000 mg | ORAL_TABLET | Freq: Every day | ORAL | 0 refills | Status: AC

## 2018-10-12 NOTE — ED Provider Notes (Signed)
[facility] EMERGENCY DEPARTMENT Provider Note   CSN: [id number] Arrival date & time: 10/11/18  2217    History   Chief Complaint Chief Complaint  Patient presents with  . Sheila Gillespie    HPI [facility] is a 43 y.o. female.     The history is provided by the patient.   43 yo F here with Sheila Gillespie, sinus pain, mild cough. Pt reports that her sx started 1 week ago with Sheila Gillespie, mild cough. They then improved byt over past 24 hours, she has developed recurrence of Sheila Gillespie that is worse than previous. It is a scratchy sensation in back of her Gillespie, worse w/ swallowing. She also notes that over this time period, she's had nasal congestion and sinus pressure along her b/l cheeks. No facial erythema or swelling. No difficulty swallowing. Her cough is mild, dry, and she denies any significant SOB. No h/o asthma. No known specific sick contacts, though she does work around "a lot" of people. No recent travel outside of [place].   History reviewed. No pertinent past medical history.  Patient Active Problem List   Diagnosis Date Noted  . Closed fracture of third metatarsal bone of left foot 10/01/2015    History reviewed. No pertinent surgical history.   OB History   No obstetric history on file.      Home Medications    Prior to Admission medications   Medication Sig Start Date End Date Taking? Authorizing Provider  amoxicillin-clavulanate (AUGMENTIN) 875-125 MG tablet Take 1 tablet by mouth every 12 (twelve) hours for 10 days. 10/12/18 10/22/18  Shaune Pollack, MD  HYDROcodone-acetaminophen (NORCO/VICODIN) 5-325 MG tablet Take 1 tablet by mouth every 6 (six) hours as needed. 03/31/16   Joy, Shawn C, PA-C  loratadine (CLARITIN) 10 MG tablet Take 1 tablet (10 mg total) by mouth daily for 10 days. 10/12/18 10/22/18  Shaune Pollack, MD  methocarbamol (ROBAXIN) 500 MG tablet Take 1 tablet (500 mg total) by mouth 2 (two) times daily. 03/31/16   Joy, Shawn C, PA-C   naproxen (NAPROSYN) 500 MG tablet Take 1 tablet (500 mg total) by mouth 2 (two) times daily. 03/31/16   Joy, Shawn C, PA-C  predniSONE (DELTASONE) 20 MG tablet Take 2 tablets (40 mg total) by mouth daily for 3 days. 10/12/18 10/15/18  Shaune Pollack, MD    Family History No family history on file.  Social History Social History   Tobacco Use  . Smoking status: Never Smoker  . Smokeless tobacco: Never Used  Substance Use Topics  . Alcohol use: No    Alcohol/week: 0.0 standard drinks  . Drug use: No     Allergies   Patient has no known allergies.   Review of Systems Review of Systems  Constitutional: Positive for fatigue. Negative for chills and fever.  HENT: Positive for congestion, sinus pressure and Sheila Gillespie. Negative for rhinorrhea.   Eyes: Negative for visual disturbance.  Respiratory: Positive for cough. Negative for shortness of breath and wheezing.   Cardiovascular: Negative for chest pain and leg swelling.  Gastrointestinal: Negative for abdominal pain, diarrhea, nausea and vomiting.  Genitourinary: Negative for dysuria, flank pain, vaginal bleeding and vaginal discharge.  Musculoskeletal: Negative for neck pain.  Skin: Negative for rash.  Allergic/Immunologic: Negative for immunocompromised state.  Neurological: Negative for syncope and headaches.  Hematological: Does not bruise/bleed easily.  All other systems reviewed and are negative.    Physical Exam Updated Vital Signs BP 140/78 (BP Location: Right Arm)  Pulse 77   Temp 98 F (36.7 C) (Oral)   Resp 18   Ht 5' (1.524 m)   Wt 92.5 kg   SpO2 100%   BMI 39.84 kg/m   Physical Exam Vitals signs and nursing note reviewed.  Constitutional:      General: She is not in acute distress.    Appearance: She is well-developed.  HENT:     Head: Normocephalic and atraumatic.     Comments: Moderate nasal congestion and sinus pressure bilaterally.  Posterior pharyngeal erythema with 3+ tonsillar swelling.   No exudates.  No uvular swelling or asymmetry.  Sinus tenderness over bilateral maxillary sinuses.  No facial swelling or erythema.  No skin lesions. Eyes:     Conjunctiva/sclera: Conjunctivae normal.  Neck:     Musculoskeletal: Neck supple.  Cardiovascular:     Rate and Rhythm: Normal rate and regular rhythm.     Heart sounds: Normal heart sounds. No murmur. No friction rub.  Pulmonary:     Effort: Pulmonary effort is normal. No respiratory distress.     Breath sounds: Normal breath sounds. No wheezing or rales.  Abdominal:     General: There is no distension.     Palpations: Abdomen is soft.     Tenderness: There is no abdominal tenderness.  Skin:    General: Skin is warm.     Capillary Refill: Capillary refill takes less than 2 seconds.  Neurological:     Mental Status: She is alert and oriented to person, place, and time.     Motor: No abnormal muscle tone.      ED Treatments / Results  Labs (all labs ordered are listed, but only abnormal results are displayed) Labs Reviewed  GROUP A STREP BY PCR    EKG None  Radiology No results found.  Procedures Procedures (including critical care time)  Medications Ordered in ED Medications - No data to display   Initial Impression / Assessment and Plan / ED Course  I have reviewed the triage vital signs and the nursing notes.  Pertinent labs & imaging results that were available during my care of the patient were reviewed by me and considered in my medical decision making (see chart for details).      43 year old female here with Sheila Gillespie, sinus pressure, and mild cough.  I suspect patient has primary sinusitis with subsequent postnasal drainage.  She may also have a component of pharyngitis.  No evidence of peritonsillar abscess or retropharyngeal abscess.  No evidence of facial cellulitis or other abscess.  No headache, neck pain, neck stiffness, or signs of meningitis or encephalitis.  She is afebrile hemodynamically  stable.  Will treat for acute bacterial sinusitis, as well as give symptomatic control for her inflammation and Sheila Gillespie.  Return precautions given.  Encourage fluids at home.  Final Clinical Impressions(s) / ED Diagnoses   Final diagnoses:  Pharyngitis, unspecified etiology    ED Discharge Orders         Ordered    predniSONE (DELTASONE) 20 MG tablet  Daily     10/12/18 0135    amoxicillin-clavulanate (AUGMENTIN) 875-125 MG tablet  Every 12 hours     10/12/18 0135    loratadine (CLARITIN) 10 MG tablet  Daily     10/12/18 0135           Shaune Pollack, MD 10/12/18 0345

## 2018-10-12 NOTE — ED Notes (Signed)
Sore throat that started 1 week ago. Denies fever.

## 2018-10-12 NOTE — Discharge Instructions (Addendum)
If your sore throat persists, consider starting Flonase or taking over-the-counter allergy medicine to help with sinus drainage  Consider a Neti pot as well if needed

## 2021-03-08 ENCOUNTER — Emergency Department (HOSPITAL_BASED_OUTPATIENT_CLINIC_OR_DEPARTMENT_OTHER)
Admission: EM | Admit: 2021-03-08 | Discharge: 2021-03-08 | Disposition: A | Discharge status: Home / Self Care | Payer: BC Managed Care – PPO | Attending: Emergency Medicine | Admitting: Emergency Medicine

## 2021-03-08 ENCOUNTER — Emergency Department (HOSPITAL_BASED_OUTPATIENT_CLINIC_OR_DEPARTMENT_OTHER): Payer: BC Managed Care – PPO

## 2021-03-08 ENCOUNTER — Other Ambulatory Visit: Payer: Self-pay

## 2021-03-08 ENCOUNTER — Encounter (HOSPITAL_BASED_OUTPATIENT_CLINIC_OR_DEPARTMENT_OTHER): Payer: Self-pay

## 2021-03-08 DIAGNOSIS — J020 Streptococcal pharyngitis: Secondary | ICD-10-CM

## 2021-03-08 DIAGNOSIS — J029 Acute pharyngitis, unspecified: Secondary | ICD-10-CM | POA: Insufficient documentation

## 2021-03-08 DIAGNOSIS — R1110 Vomiting, unspecified: Secondary | ICD-10-CM | POA: Diagnosis not present

## 2021-03-08 DIAGNOSIS — R591 Generalized enlarged lymph nodes: Secondary | ICD-10-CM | POA: Insufficient documentation

## 2021-03-08 LAB — CBC WITH DIFFERENTIAL/PLATELET
Abs Immature Granulocytes: 0.03 10*3/uL (ref 0.00–0.07)
Basophils Absolute: 0 10*3/uL (ref 0.0–0.1)
Basophils Relative: 0 %
Eosinophils Absolute: 0.2 10*3/uL (ref 0.0–0.5)
Eosinophils Relative: 1 %
HCT: 36.5 % (ref 36.0–46.0)
Hemoglobin: 12.2 g/dL (ref 12.0–15.0)
Immature Granulocytes: 0 %
Lymphocytes Relative: 9 %
Lymphs Abs: 1.2 10*3/uL (ref 0.7–4.0)
MCH: 29 pg (ref 26.0–34.0)
MCHC: 33.4 g/dL (ref 30.0–36.0)
MCV: 86.7 fL (ref 80.0–100.0)
Monocytes Absolute: 0.7 10*3/uL (ref 0.1–1.0)
Monocytes Relative: 6 %
Neutro Abs: 11.1 10*3/uL — ABNORMAL HIGH (ref 1.7–7.7)
Neutrophils Relative %: 84 %
Platelets: 278 10*3/uL (ref 150–400)
RBC: 4.21 MIL/uL (ref 3.87–5.11)
RDW: 12.9 % (ref 11.5–15.5)
WBC: 13.3 10*3/uL — ABNORMAL HIGH (ref 4.0–10.5)
nRBC: 0 % (ref 0.0–0.2)

## 2021-03-08 LAB — BASIC METABOLIC PANEL
Anion gap: 8 (ref 5–15)
BUN: 8 mg/dL (ref 6–20)
CO2: 27 mmol/L (ref 22–32)
Calcium: 8.9 mg/dL (ref 8.9–10.3)
Chloride: 102 mmol/L (ref 98–111)
Creatinine, Ser: 0.78 mg/dL (ref 0.44–1.00)
GFR, Estimated: >60 mL/min (ref >60)
Glucose, Bld: 99 mg/dL (ref 70–99)
Potassium: 3.7 mmol/L (ref 3.5–5.1)
Sodium: 137 mmol/L (ref 135–145)

## 2021-03-08 LAB — PREGNANCY, URINE: Preg Test, Ur: NEGATIVE

## 2021-03-08 LAB — GROUP A STREP BY PCR: Group A Strep by PCR: DETECTED — AB

## 2021-03-08 MED ORDER — ONDANSETRON 4 MG PO TBDP: ORAL_TABLET | ORAL | 0 refills | Status: AC

## 2021-03-08 MED ORDER — LIDOCAINE VISCOUS HCL 2 % MT SOLN
15.0000 mL | Freq: Once | OROMUCOSAL | Status: AC
  Administered 2021-03-08: 15 mL via ORAL
  Filled 2021-03-08: qty 15

## 2021-03-08 MED ORDER — SODIUM CHLORIDE 0.9 % IV BOLUS
1000.0000 mL | Freq: Once | INTRAVENOUS | Status: AC
  Administered 2021-03-08: 1000 mL via INTRAVENOUS

## 2021-03-08 MED ORDER — PENICILLIN G BENZATHINE 1200000 UNIT/2ML IM SUSY
1.2000 10*6.[IU] | PREFILLED_SYRINGE | Freq: Once | INTRAMUSCULAR | Status: AC
  Administered 2021-03-08: 1.2 10*6.[IU] via INTRAMUSCULAR
  Filled 2021-03-08: qty 2

## 2021-03-08 MED ORDER — ONDANSETRON HCL 4 MG/2ML IJ SOLN
4.0000 mg | Freq: Once | INTRAMUSCULAR | Status: AC
  Administered 2021-03-08: 4 mg via INTRAVENOUS
  Filled 2021-03-08: qty 2

## 2021-03-08 MED ORDER — IOHEXOL 300 MG/ML  SOLN
75.0000 mL | Freq: Once | INTRAMUSCULAR | Status: AC | PRN
  Administered 2021-03-08: 75 mL via INTRAVENOUS

## 2021-03-08 MED ORDER — KETOROLAC TROMETHAMINE 30 MG/ML IJ SOLN
30.0000 mg | Freq: Once | INTRAMUSCULAR | Status: AC
  Administered 2021-03-08: 30 mg via INTRAVENOUS
  Filled 2021-03-08: qty 1

## 2021-03-08 MED ORDER — DEXAMETHASONE SODIUM PHOSPHATE 10 MG/ML IJ SOLN
10.0000 mg | Freq: Once | INTRAMUSCULAR | Status: AC
  Administered 2021-03-08: 10 mg via INTRAVENOUS
  Filled 2021-03-08: qty 1

## 2021-03-08 MED ORDER — ALUM & MAG HYDROXIDE-SIMETH 200-200-20 MG/5ML PO SUSP
30.0000 mL | Freq: Once | ORAL | Status: AC
  Administered 2021-03-08: 30 mL via ORAL
  Filled 2021-03-08: qty 30

## 2021-03-08 NOTE — ED Triage Notes (Signed)
"  Woke up with sore throat in the middle of the night and hit has been hurting to swallow all day" per pt

## 2021-03-08 NOTE — ED Provider Notes (Signed)
[facility] EMERGENCY DEPARTMENT Provider Note   CSN: [id number] Arrival date & time: 03/08/21  1531     History Chief Complaint  Patient presents with   Sore Throat    [facility] is a 45 y.o. female who presented with sore throat.  Patient has sore throat since yesterday.  She states that it is hard for her to swallow now.  She states that she is drooling and has severe pain with swallowing.  Patient did not remember having strep throat in the past.  She states that she is otherwise healthy.  Denies any dental pain.  The history is provided by the patient.      History reviewed. No pertinent past medical history.  Patient Active Problem List   Diagnosis Date Noted   Closed fracture of third metatarsal bone of left foot 10/01/2015    History reviewed. No pertinent surgical history.   OB History   No obstetric history on file.     History reviewed. No pertinent family history.  Social History   Tobacco Use   Smoking status: Never   Smokeless tobacco: Never  Substance Use Topics   Alcohol use: No    Alcohol/week: 0.0 standard drinks   Drug use: No    Home Medications Prior to Admission medications   Medication Sig Start Date End Date Taking? Authorizing Provider  HYDROcodone-acetaminophen (NORCO/VICODIN) 5-325 MG tablet Take 1 tablet by mouth every 6 (six) hours as needed. 03/31/16   Joy, Shawn C, PA-C  loratadine (CLARITIN) 10 MG tablet Take 1 tablet (10 mg total) by mouth daily for 10 days. 10/12/18 10/22/18  Shaune Pollack, MD  methocarbamol (ROBAXIN) 500 MG tablet Take 1 tablet (500 mg total) by mouth 2 (two) times daily. 03/31/16   Joy, Shawn C, PA-C  naproxen (NAPROSYN) 500 MG tablet Take 1 tablet (500 mg total) by mouth 2 (two) times daily. 03/31/16   Joy, Hillard Danker, PA-C    Allergies    Patient has no known allergies.  Review of Systems   Review of Systems  HENT:  Positive for sore throat.   All other systems reviewed and are  negative.  Physical Exam Updated Vital Signs BP 101/70 (BP Location: Left Arm)   Pulse 88   Temp 98.5 F (36.9 C) (Oral)   Resp 18   Ht 5\' 6"  (1.676 m)   Wt 98 kg   LMP 02/10/2021   SpO2 99%   BMI 34.87 kg/m   Physical Exam Vitals and nursing note reviewed.  Constitutional:      Comments: Vomiting, gagging   HENT:     Head: Normocephalic.     Mouth/Throat:     Comments: Soft palate swollen. Tongue is not swollen. No obvious periapical abscess  Eyes:     Conjunctiva/sclera: Conjunctivae normal.  Neck:     Comments: There is cervical lymphadenopathy.  No obvious stridor Cardiovascular:     Heart sounds: Normal heart sounds.  Pulmonary:     Effort: Pulmonary effort is normal.     Breath sounds: Normal breath sounds.  Abdominal:     General: Bowel sounds are normal.     Palpations: Abdomen is soft.  Skin:    General: Skin is warm.     Capillary Refill: Capillary refill takes less than 2 seconds.  Neurological:     General: No focal deficit present.     Mental Status: She is oriented to person, place, and time.  Psychiatric:  Mood and Affect: Mood normal.        Behavior: Behavior normal.    ED Results / Procedures / Treatments   Labs (all labs ordered are listed, but only abnormal results are displayed) Labs Reviewed  GROUP A STREP BY PCR - Abnormal; Notable for the following components:      Result Value   Group A Strep by PCR DETECTED (*)    All other components within normal limits  CBC WITH DIFFERENTIAL/PLATELET - Abnormal; Notable for the following components:   WBC 13.3 (*)    Neutro Abs 11.1 (*)    All other components within normal limits  BASIC METABOLIC PANEL  PREGNANCY, URINE    EKG None  Radiology CT Soft Tissue Neck W Contrast  Result Date: 03/08/2021 CLINICAL DATA:  Neck abscess deep tissue. Sore throat. Pain with swallowing. EXAM: CT NECK WITH CONTRAST TECHNIQUE: Multidetector CT imaging of the neck was performed using the standard  protocol following the bolus administration of intravenous contrast. CONTRAST:  59mL OMNIPAQUE IOHEXOL 300 MG/ML  SOLN COMPARISON:  None FINDINGS: Pharynx and larynx: Prominent adenoid tissue present. No discrete lesion or abscess. Soft palate is thickened. Palatine tonsils are enlarged bilaterally. Parapharyngeal fat is clear. Mild prominence of lingual tonsils noted. The airway is patent. No discrete fluid collections are present. Vallecula and epiglottis are within normal limits. Aryepiglottic folds and piriform sinuses are clear. Vocal cords are midline and symmetric. Trachea is clear. Salivary glands: The submandibular and parotid glands and ducts are within normal limits. Thyroid: Normal Lymph nodes: Bilateral level 2 and level 3 reactive nodes present. No necrotic rounded nodes. Vascular: No acute or focal vascular abnormalities are present. Limited intracranial: Visualized intracranial contents within normal limits. Visualized orbits: The globes and orbits are within normal limits. Mastoids and visualized paranasal sinuses: Mucosal thickening is present in the inferior maxillary sinuses bilaterally. Skeleton: Vertebral body heights and alignment are normal. No focal osseous lesions are present. Upper chest: Lung apices are clear. Thoracic inlet is within normal limits. IMPRESSION: 1. Prominent adenoid and palatine tonsils compatible with acute pharyngitis. 2. No discrete lesion or abscess.  Airways are patent. 3. Bilateral reactive level 2 and level 3 lymph nodes. Electronically Signed   By: Marin Roberts M.D.   On: 03/08/2021 18:37    Procedures Procedures   Medications Ordered in ED Medications  penicillin g benzathine (BICILLIN LA) 1200000 UNIT/2ML injection 1.2 Million Units (has no administration in time range)  sodium chloride 0.9 % bolus 1,000 mL (1,000 mLs Intravenous New Bag/Given 03/08/21 1730)  ondansetron (ZOFRAN) injection 4 mg (4 mg Intravenous Given 03/08/21 1731)  dexamethasone  (DECADRON) injection 10 mg (10 mg Intravenous Given 03/08/21 1730)  iohexol (OMNIPAQUE) 300 MG/ML solution 75 mL (75 mLs Intravenous Contrast Given 03/08/21 1824)    ED Course  I have reviewed the triage vital signs and the nursing notes.  Pertinent labs & imaging results that were available during my care of the patient were reviewed by me and considered in my medical decision making (see chart for details).    MDM Rules/Calculators/A&P                          [facility] is a 45 y.o. female who presented with sore throat and trouble swallowing.  Patient's neck is swollen and soft palate swollen.  Concern for possible deep space infection.  Will swab for strep and get CT neck and lab work.  We will  give decadron   6:53 PM Strep is positive.  CT showed no deep space infection and just signs of pharyngitis.  Patient offered oral amoxicillin versus Bicillin shot.  She states that she wants the Bicillin shot.  Stable for discharge.  Final Clinical Impression(s) / ED Diagnoses Final diagnoses:  None    Rx / DC Orders ED Discharge Orders     None        Charlynne Pander, MD 03/08/21 [phone]

## 2021-03-08 NOTE — Discharge Instructions (Addendum)
Take zofran for nausea.   You have strep throat and was given antibiotics in the ED  See your doctor for follow-up  Return to ER if you have worse difficulty swallowing, trouble breathing, fever.

## 2021-05-21 ENCOUNTER — Emergency Department (HOSPITAL_BASED_OUTPATIENT_CLINIC_OR_DEPARTMENT_OTHER): Payer: BC Managed Care – PPO

## 2021-05-21 ENCOUNTER — Emergency Department (HOSPITAL_BASED_OUTPATIENT_CLINIC_OR_DEPARTMENT_OTHER)
Admission: EM | Admit: 2021-05-21 | Discharge: 2021-05-21 | Disposition: A | Discharge status: Home / Self Care | Payer: BC Managed Care – PPO | Attending: Emergency Medicine | Admitting: Emergency Medicine

## 2021-05-21 ENCOUNTER — Other Ambulatory Visit: Payer: Self-pay

## 2021-05-21 ENCOUNTER — Encounter (HOSPITAL_BASED_OUTPATIENT_CLINIC_OR_DEPARTMENT_OTHER): Payer: Self-pay

## 2021-05-21 DIAGNOSIS — Z28310 Unvaccinated for covid-19: Secondary | ICD-10-CM | POA: Diagnosis not present

## 2021-05-21 DIAGNOSIS — Z20822 Contact with and (suspected) exposure to covid-19: Secondary | ICD-10-CM | POA: Insufficient documentation

## 2021-05-21 DIAGNOSIS — J209 Acute bronchitis, unspecified: Secondary | ICD-10-CM | POA: Diagnosis not present

## 2021-05-21 DIAGNOSIS — R059 Cough, unspecified: Secondary | ICD-10-CM | POA: Diagnosis present

## 2021-05-21 LAB — RESP PANEL BY RT-PCR (FLU A&B, COVID) ARPGX2
Influenza A by PCR: NEGATIVE
Influenza B by PCR: NEGATIVE
SARS Coronavirus 2 by RT PCR: NEGATIVE

## 2021-05-21 LAB — PREGNANCY, URINE: Preg Test, Ur: NEGATIVE

## 2021-05-21 MED ORDER — AZITHROMYCIN 250 MG PO TABS: 250.0000 mg | ORAL_TABLET | Freq: Every day | ORAL | 0 refills | Status: AC

## 2021-05-21 NOTE — ED Triage Notes (Signed)
Pt c/o flu like sx x 5 days-NAD-steady gait 

## 2021-05-21 NOTE — Discharge Instructions (Addendum)
You were seen in the emergency department for 5 days of cough.  Your pregnancy test was negative and your chest x-ray did not show any pneumonia.  Your COVID and flu test were negative.  We are treating you with antibiotics for possible bacterial bronchitis.  Please rest and drink plenty of fluids.  Follow-up with your doctor.  Return to the emergency department if any worsening or concerning symptoms

## 2021-05-21 NOTE — ED Provider Notes (Signed)
[facility] EMERGENCY DEPARTMENT Provider Note   CSN: [id number] Arrival date & time: 05/21/21  1924     History Chief Complaint  Patient presents with   Cough    Sheila Gillespie is a 45 y.o. female.  She is here with a complaint of flulike symptoms for 5 days.  Nonproductive cough body aches chest tightness.  Short of breath at times.  No fever.  No nausea vomiting diarrhea.  She had multiple sick contacts at work who ended up being positive for COVID.  She is not COVID or flu vaccinated.  The history is provided by the patient.  Cough Cough characteristics:  Non-productive Sputum characteristics:  Nondescript Severity:  Moderate Onset quality:  Gradual Duration:  5 days Timing:  Intermittent Progression:  Unchanged Chronicity:  New Smoker: no   Context: sick contacts   Relieved by:  None tried Worsened by:  Nothing Ineffective treatments:  None tried Associated symptoms: chest pain, myalgias and shortness of breath   Associated symptoms: no ear pain, no fever, no rash and no sore throat       History reviewed. No pertinent past medical history.  Patient Active Problem List   Diagnosis Date Noted   Closed fracture of third metatarsal bone of left foot 10/01/2015    History reviewed. No pertinent surgical history.   OB History   No obstetric history on file.     No family history on file.  Social History   Tobacco Use   Smoking status: Never   Smokeless tobacco: Never  Vaping Use   Vaping Use: Never used  Substance Use Topics   Alcohol use: No    Alcohol/week: 0.0 standard drinks   Drug use: No    Home Medications Prior to Admission medications   Medication Sig Start Date End Date Taking? Authorizing Provider  HYDROcodone-acetaminophen (NORCO/VICODIN) 5-325 MG tablet Take 1 tablet by mouth every 6 (six) hours as needed. 03/31/16   Joy, Shawn C, PA-C  loratadine (CLARITIN) 10 MG tablet Take 1 tablet (10 mg total) by mouth daily for 10 days.  10/12/18 10/22/18  Shaune Pollack, MD  methocarbamol (ROBAXIN) 500 MG tablet Take 1 tablet (500 mg total) by mouth 2 (two) times daily. 03/31/16   Joy, Shawn C, PA-C  naproxen (NAPROSYN) 500 MG tablet Take 1 tablet (500 mg total) by mouth 2 (two) times daily. 03/31/16   Joy, Hillard Danker, PA-C  ondansetron (ZOFRAN ODT) 4 MG disintegrating tablet 4mg  ODT q4 hours prn nausea/vomit 03/08/21   05/08/21, MD    Allergies    Patient has no known allergies.  Review of Systems   Review of Systems  Constitutional:  Negative for fever.  HENT:  Negative for ear pain and sore throat.   Eyes:  Negative for visual disturbance.  Respiratory:  Positive for cough and shortness of breath.   Cardiovascular:  Positive for chest pain.  Gastrointestinal:  Negative for abdominal pain.  Genitourinary:  Negative for dysuria.  Musculoskeletal:  Positive for myalgias.  Skin:  Negative for rash.  Neurological:  Negative for syncope.   Physical Exam Updated Vital Signs BP 126/78 (BP Location: Right Arm)   Pulse 68   Temp 97.8 F (36.6 C) (Oral)   Resp 18   Ht 5\' 6"  (1.676 m)   Wt 97.5 kg   LMP 04/09/2021   SpO2 100%   BMI 34.70 kg/m   Physical Exam Vitals and nursing note reviewed.  Constitutional:      General:  She is not in acute distress.    Appearance: Normal appearance. She is well-developed.  HENT:     Head: Normocephalic and atraumatic.  Eyes:     Conjunctiva/sclera: Conjunctivae normal.  Cardiovascular:     Rate and Rhythm: Normal rate and regular rhythm.     Heart sounds: No murmur heard. Pulmonary:     Effort: Pulmonary effort is normal. No respiratory distress.     Breath sounds: Normal breath sounds.  Abdominal:     Palpations: Abdomen is soft.     Tenderness: There is no abdominal tenderness. There is no guarding or rebound.  Musculoskeletal:        General: No deformity or signs of injury. Normal range of motion.     Cervical back: Neck supple.  Skin:    General: Skin is  warm and dry.  Neurological:     General: No focal deficit present.     Mental Status: She is alert.     Gait: Gait normal.    ED Results / Procedures / Treatments   Labs (all labs ordered are listed, but only abnormal results are displayed) Labs Reviewed  RESP PANEL BY RT-PCR (FLU A&B, COVID) [id number]  PREGNANCY, URINE    EKG None  Radiology DG Chest Port 1 View  Result Date: 05/21/2021 CLINICAL DATA:  Cough.  Flu like symptoms for 5 days. EXAM: PORTABLE CHEST 1 VIEW COMPARISON:  None. FINDINGS: The cardiomediastinal contours are normal. Minor bibasilar atelectasis. Pulmonary vasculature is normal. No consolidation, pleural effusion, or pneumothorax. No acute osseous abnormalities are seen. IMPRESSION: Minor bibasilar atelectasis. Electronically Signed   By: Narda Rutherford M.D.   On: 05/21/2021 20:02    Procedures Procedures   Medications Ordered in ED Medications - No data to display  ED Course  I have reviewed the triage vital signs and the nursing notes.  Pertinent labs & imaging results that were available during my care of the patient were reviewed by me and considered in my medical decision making (see chart for details).  Clinical Course as of 05/22/21 1033  Fri May 21, 2021  1958 Chest x-ray interpreted by me as no acute infiltrates.  Awaiting radiology reading. [MB]  2055 Reviewed results with patient.  Will cover with Zithromax for possible bacterial bronchitis.  Return instructions discussed [MB]    Clinical Course User Index [MB] Terrilee Files, MD   MDM Rules/Calculators/A&P                          [facility] was evaluated in Emergency Department on 05/21/2021 for the symptoms described in the history of present illness. She was evaluated in the context of the global COVID-19 pandemic, which necessitated consideration that the patient might be at risk for infection with the SARS-CoV-2 virus that causes COVID-19. Institutional protocols and algorithms  that pertain to the evaluation of patients at risk for COVID-19 are in a state of rapid change based on information released by regulatory bodies including the CDC and federal and state organizations. These policies and algorithms were followed during the patient's care in the ED.  Patient here with cough myalgias sore throat in the setting of recent contact with sick contacts having COVID and flu.  Patient is not vaccinated.  [place] includes COVID, flu, viral syndrome, bronchitis.  Chest x-ray ordered and interpreted by me as no acute findings.  COVID flu negative.  We will treat empirically with antibiotics and plan for outpatient follow-up  with her provider.  Return instructions discussed  Final Clinical Impression(s) / ED Diagnoses Final diagnoses:  Acute bronchitis, unspecified organism    Rx / DC Orders ED Discharge Orders          Ordered    azithromycin (ZITHROMAX) 250 MG tablet  Daily        05/21/21 2056             Terrilee Files, MD 05/22/21 1034

## 2021-08-11 IMAGING — CT CT NECK W/ CM
4 series · 14 of 33 positions shown, 17 images · IV contrast (omnipaque)
Comparison: None

CLINICAL DATA: Neck abscess deep tissue. Sore throat. Pain with
swallowing.

EXAM:
CT NECK WITH CONTRAST
TECHNIQUE: Multidetector CT imaging of the neck was performed using the
standard protocol following the bolus administration of intravenous
contrast.
CONTRAST:  75mL OMNIPAQUE IOHEXOL 300 MG/ML  SOLN

[Series 4: axial neck · axial · 0.74mm/px · z∈[-332,-148]mm · 5 of 140 slices shown, 7 images]
[im 24/140  soft-tissue]
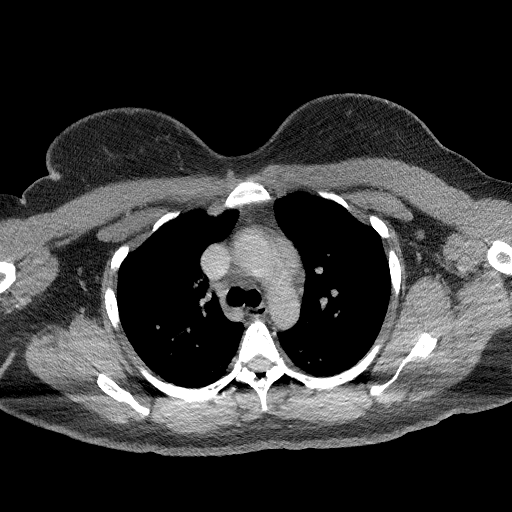
[im 24/140  bone]
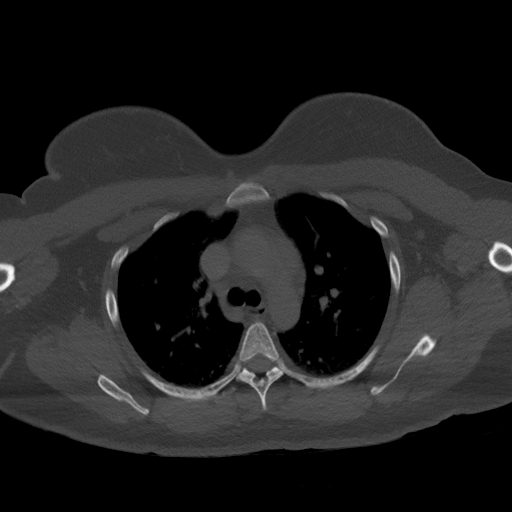
[im 47/140  bone]
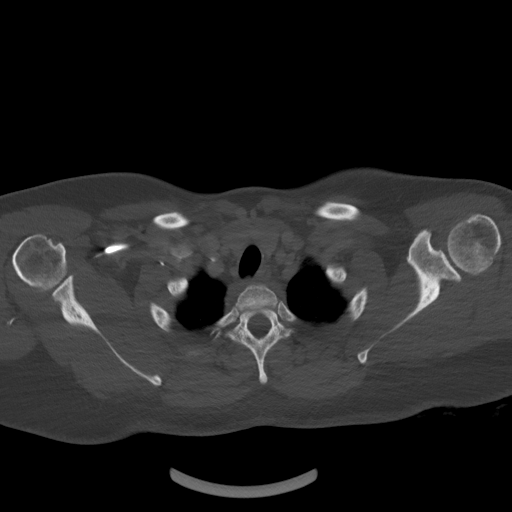
[im 70/140  bone]
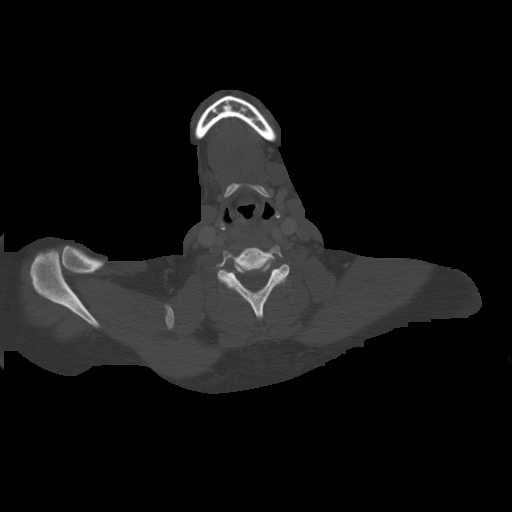
[im 93/140  bone]
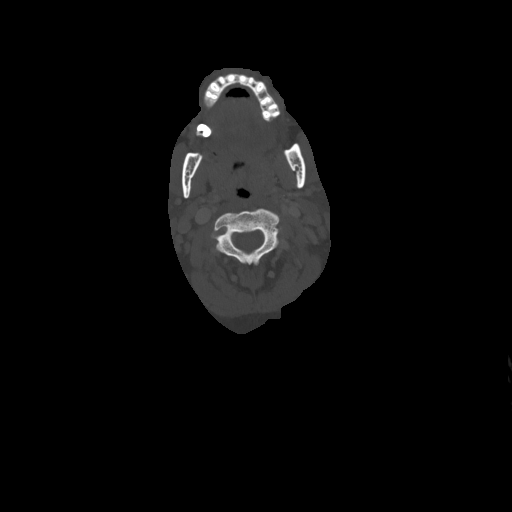
[im 116/140  soft-tissue]
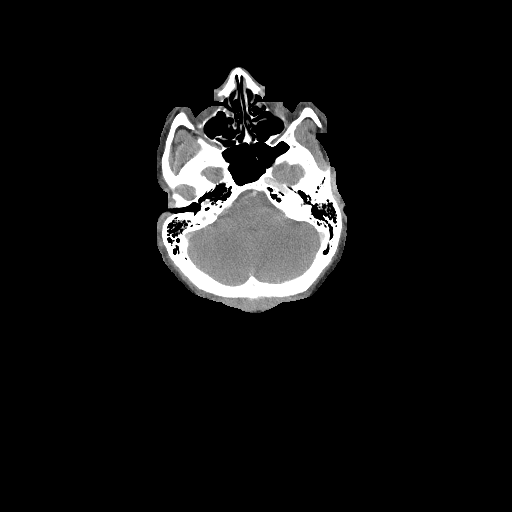
[im 116/140  bone]
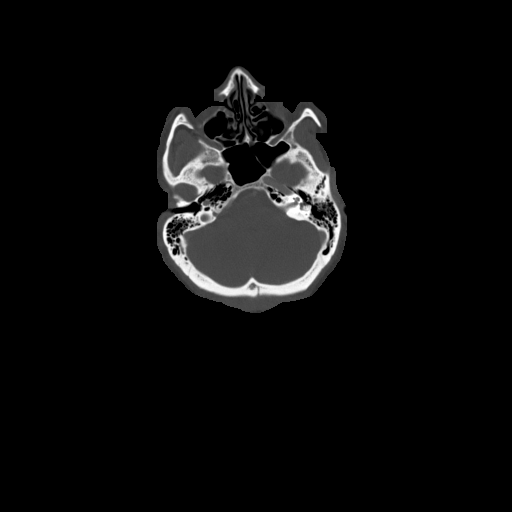

[Series 7: sag neck · sagittal · 0.59mm/px · 5 of 121 slices shown, 6 images]
[im 41/121  bone]
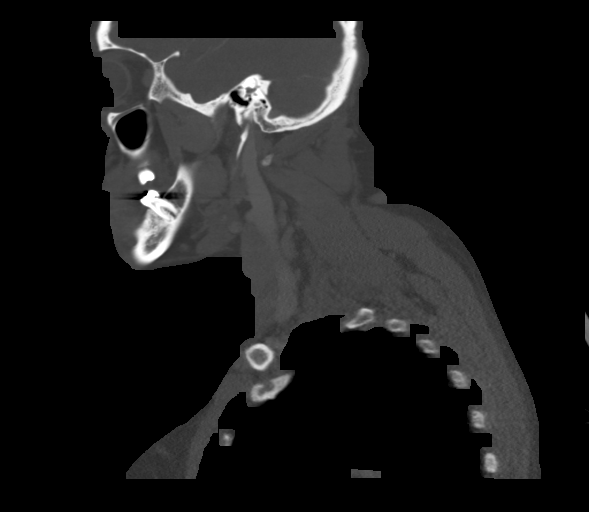
[im 51/121  bone]
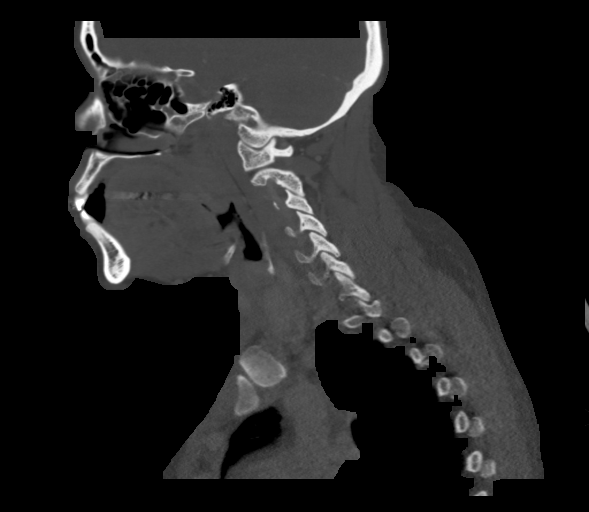
[im 61/121  soft-tissue]
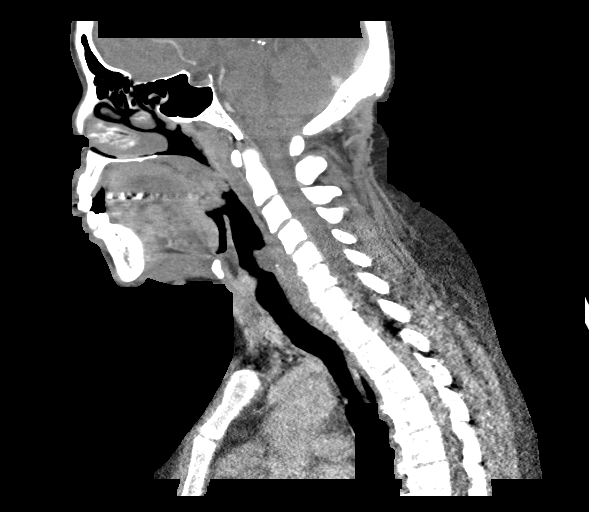
[im 61/121  bone]
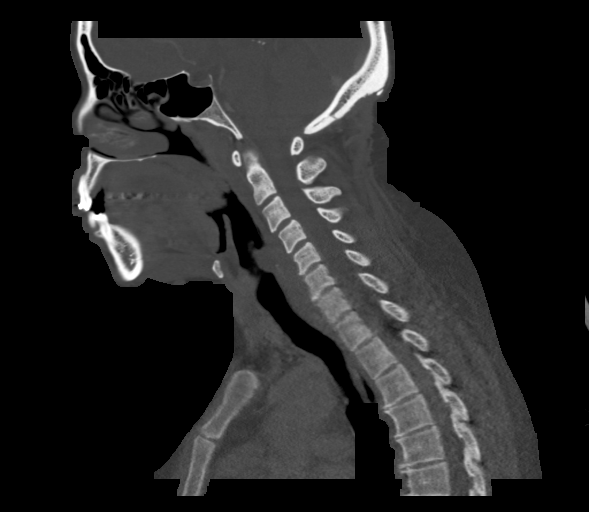
[im 71/121  bone]
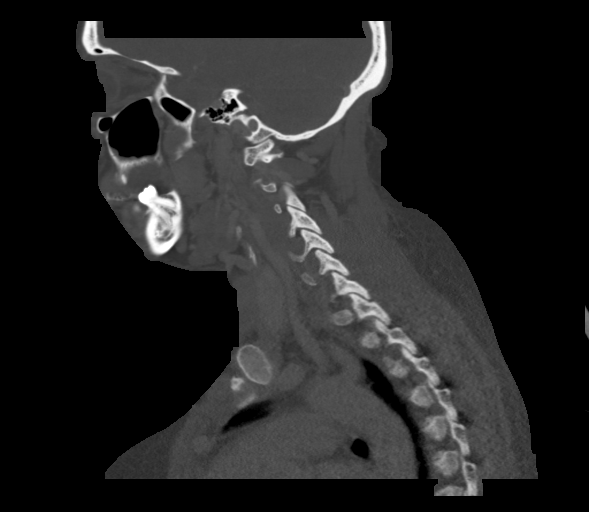
[im 81/121  bone]
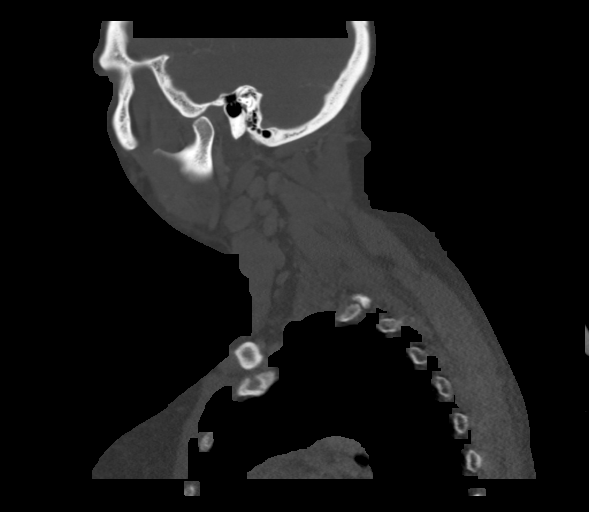

[Series 8: cor neck · coronal · 0.54mm/px · 3 of 163 slices shown]
[im 33/163  bone]
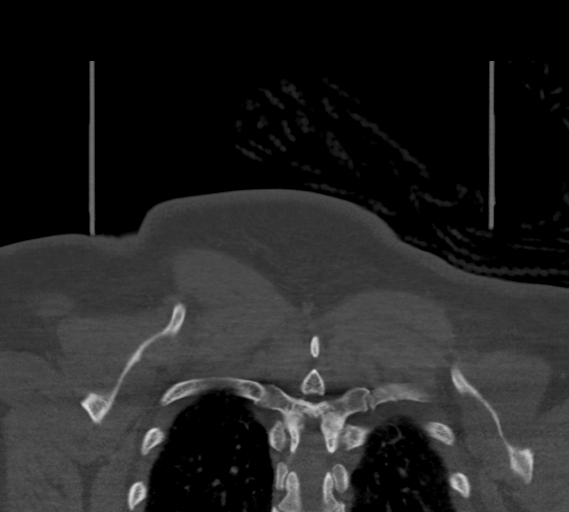
[im 65/163  bone]
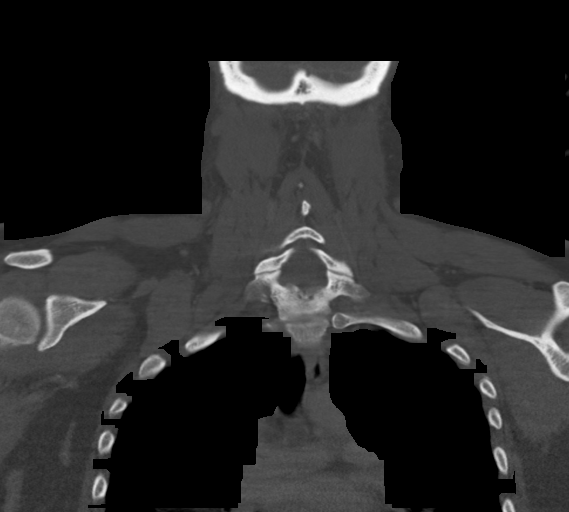
[im 98/163  bone]
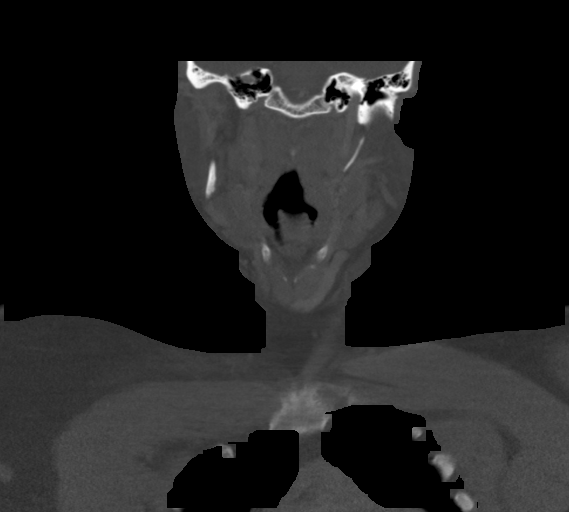

[Series 9: ax oropharynx · axial · 0.56mm/px · 1 of 140 slices shown]
[im 24/140  bone]
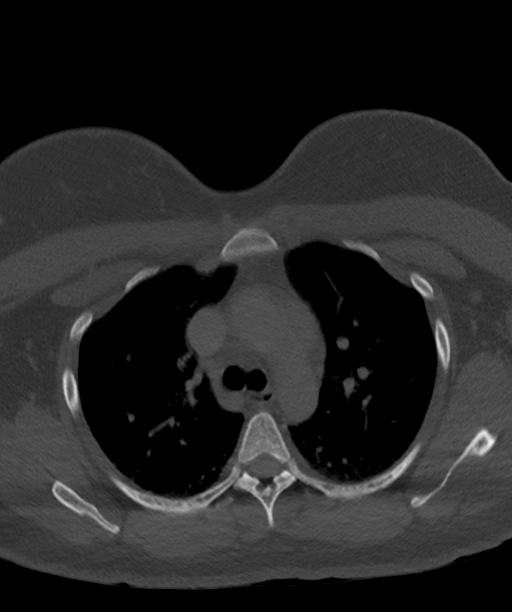

[14 of 33 positions shown; findings below may reference images not displayed]

FINDINGS: Pharynx and larynx: Prominent adenoid tissue present. No discrete
lesion or abscess. Soft palate is thickened. Palatine tonsils are
enlarged bilaterally. Parapharyngeal fat is clear. Mild prominence
of lingual tonsils noted. The airway is patent. No discrete fluid
collections are present. Vallecula and epiglottis are within normal
limits. Aryepiglottic folds and piriform sinuses are clear. Vocal
cords are midline and symmetric. Trachea is clear.

Salivary glands: The submandibular and parotid glands and ducts are
within normal limits.

Thyroid: Normal

Lymph nodes: Bilateral level 2 and level 3 reactive nodes present.
No necrotic rounded nodes.

Vascular: No acute or focal vascular abnormalities are present.

Limited intracranial: Visualized intracranial contents within normal
limits.

Visualized orbits: The globes and orbits are within normal limits.

Mastoids and visualized paranasal sinuses: Mucosal thickening is
present in the inferior maxillary sinuses bilaterally.

Skeleton: Vertebral body heights and alignment are normal. No focal
osseous lesions are present.

Upper chest: Lung apices are clear. Thoracic inlet is within normal
limits.
IMPRESSION: 1. Prominent adenoid and palatine tonsils compatible with acute
pharyngitis.
2. No discrete lesion or abscess.  Airways are patent.
3. Bilateral reactive level 2 and level 3 lymph nodes.

## 2022-10-17 ENCOUNTER — Encounter (HOSPITAL_BASED_OUTPATIENT_CLINIC_OR_DEPARTMENT_OTHER): Payer: Self-pay

## 2022-10-17 ENCOUNTER — Emergency Department (HOSPITAL_BASED_OUTPATIENT_CLINIC_OR_DEPARTMENT_OTHER)
Admission: EM | Admit: 2022-10-17 | Discharge: 2022-10-17 | Disposition: A | Discharge status: Home / Self Care | Payer: BC Managed Care – PPO | Attending: Emergency Medicine | Admitting: Emergency Medicine

## 2022-10-17 DIAGNOSIS — S6992XA Unspecified injury of left wrist, hand and finger(s), initial encounter: Secondary | ICD-10-CM | POA: Diagnosis present

## 2022-10-17 DIAGNOSIS — W268XXA Contact with other sharp object(s), not elsewhere classified, initial encounter: Secondary | ICD-10-CM | POA: Insufficient documentation

## 2022-10-17 DIAGNOSIS — Z23 Encounter for immunization: Secondary | ICD-10-CM | POA: Insufficient documentation

## 2022-10-17 DIAGNOSIS — S61412A Laceration without foreign body of left hand, initial encounter: Secondary | ICD-10-CM | POA: Diagnosis not present

## 2022-10-17 MED ORDER — ACETAMINOPHEN 500 MG PO TABS
1000.0000 mg | ORAL_TABLET | Freq: Once | ORAL | Status: AC
  Administered 2022-10-17: 1000 mg via ORAL
  Filled 2022-10-17: qty 2

## 2022-10-17 MED ORDER — BACITRACIN ZINC 500 UNIT/GM EX OINT
TOPICAL_OINTMENT | Freq: Two times a day (BID) | CUTANEOUS | Status: DC
  Administered 2022-10-17: 31.5 via TOPICAL
  Filled 2022-10-17: qty 28.35

## 2022-10-17 MED ORDER — TETANUS-DIPHTH-ACELL PERTUSSIS 5-2.5-18.5 LF-MCG/0.5 IM SUSY
0.5000 mL | PREFILLED_SYRINGE | Freq: Once | INTRAMUSCULAR | Status: AC
  Administered 2022-10-17: 0.5 mL via INTRAMUSCULAR
  Filled 2022-10-17: qty 0.5

## 2022-10-17 MED ORDER — LIDOCAINE-EPINEPHRINE (PF) 2 %-1:200000 IJ SOLN
10.0000 mL | Freq: Once | INTRAMUSCULAR | Status: AC
  Administered 2022-10-17: 10 mL
  Filled 2022-10-17: qty 20

## 2022-10-17 NOTE — Discharge Instructions (Addendum)
Please return to urgent care or primary care in 7 days to get a suture removal.  Apply bacitracin ointment on the wound.  Return to the emergency department if new or worsening symptoms.

## 2022-10-17 NOTE — ED Triage Notes (Signed)
Pt arrives to the ER this morning with a cut to her left palm. Pt states that she was trying to get a hole into her belt and the belt fastener slipped and she cut her left palm.

## 2022-10-17 NOTE — ED Notes (Signed)
ED Provider at bedside. 

## 2022-10-17 NOTE — ED Provider Notes (Addendum)
[facility] Provider Note   CSN: [id number] Arrival date & time: 10/17/22  [id number]     History  Chief Complaint  Patient presents with   Hand Injury    [facility] is a 47 y.o. female otherwise healthy presents today for evaluation of a laceration on her left hand.  Patient states that she was trying to get a hold went about, to be fast and slipped and cut her left palm.  She has a laceration in the palmar aspect of the left thumb and near the base of the ring finger.  Bleeding is controlled in triage.  She is not sure when her last tetanus shot was.  Denies loss of sensation on her L hand.  She is able to move her L hand.   Hand Injury   History reviewed. No pertinent past medical history. History reviewed. No pertinent surgical history.   Home Medications Prior to Admission medications   Medication Sig Start Date End Date Taking? Authorizing Provider  azithromycin (ZITHROMAX) 250 MG tablet Take 1 tablet (250 mg total) by mouth daily. Take first 2 tablets together, then 1 every day until finished. 05/21/21   Hayden Rasmussen, MD  HYDROcodone-acetaminophen (NORCO/VICODIN) 5-325 MG tablet Take 1 tablet by mouth every 6 (six) hours as needed. 03/31/16   Joy, Shawn C, PA-C  loratadine (CLARITIN) 10 MG tablet Take 1 tablet (10 mg total) by mouth daily for 10 days. 10/12/18 10/22/18  Duffy Bruce, MD  methocarbamol (ROBAXIN) 500 MG tablet Take 1 tablet (500 mg total) by mouth 2 (two) times daily. 03/31/16   Joy, Shawn C, PA-C  naproxen (NAPROSYN) 500 MG tablet Take 1 tablet (500 mg total) by mouth 2 (two) times daily. 03/31/16   Joy, Raquel Sarna C, PA-C  ondansetron (ZOFRAN ODT) 4 MG disintegrating tablet 4mg  ODT q4 hours prn nausea/vomit 03/08/21   Drenda Freeze, MD      Allergies    Patient has no known allergies.    Review of Systems   Review of Systems Negative except as per HPI.  Physical Exam Updated Vital Signs BP 99/63 (BP  Location: Right Arm)   Pulse 63   Temp 98.4 F (36.9 C) (Oral)   Resp 18   Ht 5\' 6"  (1.676 m)   Wt 83.9 kg   LMP 10/09/2022 (Approximate)   SpO2 100%   BMI 29.86 kg/m  Physical Exam Vitals and nursing note reviewed.  Constitutional:      Appearance: Normal appearance.  HENT:     Head: Normocephalic and atraumatic.     Mouth/Throat:     Mouth: Mucous membranes are moist.  Eyes:     General: No scleral icterus. Cardiovascular:     Rate and Rhythm: Normal rate and regular rhythm.     Pulses: Normal pulses.     Heart sounds: Normal heart sounds.  Pulmonary:     Effort: Pulmonary effort is normal.     Breath sounds: Normal breath sounds.  Abdominal:     General: Abdomen is flat.     Palpations: Abdomen is soft.     Tenderness: There is no abdominal tenderness.  Musculoskeletal:        General: No deformity.  Skin:    General: Skin is warm.     Findings: No rash.     Comments: 1 cm superficial laceration in the palmar aspect of the left thumb and 1 cm superficial laceration in the palmar aspect near the base  of the L ring finger.  Neurovascular intact.  Neurological:     General: No focal deficit present.     Mental Status: She is alert.  Psychiatric:        Mood and Affect: Mood normal.     ED Results / Procedures / Treatments   Labs (all labs ordered are listed, but only abnormal results are displayed) Labs Reviewed - No data to display  EKG None  Radiology No results found.  Procedures .Marland KitchenLaceration Repair  Date/Time: 10/17/2022 9:37 AM  Performed by: Rex Kras, PA Authorized by: Rex Kras, PA   Consent:    Consent obtained:  Verbal   Consent given by:  Patient   Risks, benefits, and alternatives were discussed: yes     Risks discussed:  Infection and pain Universal protocol:    Procedure explained and questions answered to patient or proxy's satisfaction: yes     Relevant documents present and verified: yes     Patient identity confirmed:  Verbally with  patient and arm band Anesthesia:    Anesthesia method:  Local infiltration   Local anesthetic:  Lidocaine 1% WITH epi Laceration details:    Location: L palm.   Length (cm):  1.5   Depth (mm):  2 Pre-procedure details:    Preparation:  Patient was prepped and draped in usual sterile fashion Exploration:    Limited defect created (wound extended): no     Hemostasis achieved with:  Direct pressure   Imaging outcome: foreign body not noted     Wound exploration: wound explored through full range of motion     Contaminated: no   Treatment:    Area cleansed with:  Saline   Amount of cleaning:  Extensive   Irrigation solution:  Sterile saline   Irrigation volume:  200   Irrigation method:  Pressure wash   Visualized foreign bodies/material removed: no     Debridement:  None   Undermining:  None   Scar revision: yes   Skin repair:    Repair method:  Sutures   Suture size:  4-0   Suture material:  Nylon   Suture technique:  Simple interrupted   Number of sutures:  3 Approximation:    Approximation:  Close Repair type:    Repair type:  Simple Post-procedure details:    Dressing:  Antibiotic ointment   Procedure completion:  Tolerated well, no immediate complications .Marland KitchenLaceration Repair  Date/Time: 10/17/2022 9:40 AM  Performed by: Rex Kras, PA Authorized by: Rex Kras, PA   Consent:    Consent obtained:  Verbal   Consent given by:  Patient   Risks discussed:  Infection and pain Universal protocol:    Procedure explained and questions answered to patient or proxy's satisfaction: yes     Relevant documents present and verified: yes     Patient identity confirmed:  Verbally with patient and arm band Anesthesia:    Anesthesia method:  Local infiltration   Local anesthetic:  Lidocaine 1% WITH epi Laceration details:    Location: L palm near ring finger.   Length (cm):  1   Depth (mm):  2 Pre-procedure details:    Preparation:  Patient was prepped and draped in usual sterile  fashion and imaging obtained to evaluate for foreign bodies Exploration:    Limited defect created (wound extended): no     Hemostasis achieved with:  Direct pressure   Imaging outcome: foreign body not noted     Contaminated: no   Treatment:  Area cleansed with:  Saline   Amount of cleaning:  Standard   Irrigation solution:  Sterile water   Irrigation volume:  100   Irrigation method:  Pressure wash   Visualized foreign bodies/material removed: no     Debridement:  None   Undermining:  None Skin repair:    Repair method:  Sutures   Suture size:  4-0   Suture material:  Nylon   Suture technique:  Simple interrupted   Number of sutures:  1 Approximation:    Approximation:  Close Repair type:    Repair type:  Simple Post-procedure details:    Dressing:  Antibiotic ointment   Procedure completion:  Tolerated well, no immediate complications     Medications Ordered in ED Medications  bacitracin ointment (has no administration in time range)  lidocaine-EPINEPHrine (XYLOCAINE W/EPI) 2 %-1:200000 (PF) injection 10 mL (10 mLs Infiltration Given by Other 10/17/22 0919)  Tdap (BOOSTRIX) injection 0.5 mL (0.5 mLs Intramuscular Given 10/17/22 0915)  acetaminophen (TYLENOL) tablet 1,000 mg (1,000 mg Oral Given 10/17/22 0919)    ED Course/ Medical Decision Making/ A&P                             Medical Decision Making Risk OTC drugs. Prescription drug management.   This patient presents to the ED for hand laceration, this involves an extensive number of treatment options, and is a complaint that carries with a high risk of complications and morbidity.  The differential diagnosis includes laceration, cellulitis, foreign body.  This is not an exhaustive list.  Lab tests:  Imaging studies:  Problem list/ ED course/ Critical interventions/ Medical management: HPI: See above Vital signs within normal range and stable throughout visit. Laboratory/imaging studies significant for:  See above. On physical examination, patient is afebrile and appears in no acute distress. Wound inspected under direct bright light with good visualization. Area with linear laceration across soft tissue through adipose without exposure of muscle belly or tendon. No overt foreign body. Area hemostatic. Neurovascular exam congruent with above. Area extensively irrigated with sterile normal saline under pressure. Laceration repaired in simple fashion as above (please see procedure note for further details). Patient tolerated procedure well and neurovascular exam intact and unchanged post repair with intact distal pulses and cap refill. Cautious return precautions discussed w/ full understanding. Wound care discussed. Prompt follow up with primary care physician discussed and return for suture removal in 7 days. I have reviewed the patient home medicines and have made adjustments as needed.  Cardiac monitoring/EKG: The patient was maintained on a cardiac monitor.  I personally reviewed and interpreted the cardiac monitor which showed an underlying rhythm of: sinus rhythm.  Additional history obtained: External records from outside source obtained and reviewed including: Chart review including previous notes, labs, imaging.  Consultations obtained:  Disposition Continued outpatient therapy. Follow-up with PCP or urgent care recommended for reevaluation of symptoms. Treatment plan discussed with patient.  Pt acknowledged understanding was agreeable to the plan. Worrisome signs and symptoms were discussed with patient, and patient acknowledged understanding to return to the ED if they noticed these signs and symptoms. Patient was stable upon discharge.   This chart was dictated using voice recognition software.  Despite best efforts to proofread,  errors can occur which can change the documentation meaning.          Final Clinical Impression(s) / ED Diagnoses Final diagnoses:  Laceration of left  hand without foreign  body, initial encounter    Rx / DC Orders ED Discharge Orders     None         Rex Kras, [place] 10/17/22 0936    Rex Kras, [place] 10/17/22 [id number]    Wyvonnia Dusky, MD 10/17/22 [phone]

## 2022-11-29 ENCOUNTER — Encounter (HOSPITAL_BASED_OUTPATIENT_CLINIC_OR_DEPARTMENT_OTHER): Payer: Self-pay | Admitting: Pediatrics

## 2022-11-29 ENCOUNTER — Emergency Department (HOSPITAL_BASED_OUTPATIENT_CLINIC_OR_DEPARTMENT_OTHER)
Admission: EM | Admit: 2022-11-29 | Discharge: 2022-11-29 | Disposition: A | Discharge status: Home / Self Care | Payer: BC Managed Care – PPO | Attending: Emergency Medicine | Admitting: Emergency Medicine

## 2022-11-29 ENCOUNTER — Emergency Department (HOSPITAL_BASED_OUTPATIENT_CLINIC_OR_DEPARTMENT_OTHER): Payer: BC Managed Care – PPO

## 2022-11-29 ENCOUNTER — Other Ambulatory Visit: Payer: Self-pay

## 2022-11-29 DIAGNOSIS — S62665A Nondisplaced fracture of distal phalanx of left ring finger, initial encounter for closed fracture: Secondary | ICD-10-CM

## 2022-11-29 DIAGNOSIS — W2209XA Striking against other stationary object, initial encounter: Secondary | ICD-10-CM | POA: Diagnosis not present

## 2022-11-29 DIAGNOSIS — M79645 Pain in left finger(s): Secondary | ICD-10-CM | POA: Diagnosis present

## 2022-11-29 MED ORDER — IBUPROFEN 400 MG PO TABS
600.0000 mg | ORAL_TABLET | Freq: Once | ORAL | Status: AC
  Administered 2022-11-29: 600 mg via ORAL
  Filled 2022-11-29: qty 1

## 2022-11-29 NOTE — Discharge Instructions (Addendum)
Return to the ED with any new or worsening signs or symptoms Please remain in the finger splint until seen by hand doctor You may take ibuprofen or Tylenol for pain Please read attached guide concerning finger fracture Please follow-up with Dr. Dion Saucier for reassessment.

## 2022-11-29 NOTE — ED Provider Notes (Signed)
[facility] Provider Note   CSN: [id number] Arrival date & time: 11/29/22  1712     History  Chief Complaint  Patient presents with   Finger Injury    [facility] is a 47 y.o. female with no documented medical history.  The patient presents to ED for evaluation of left ring finger pain.  Patient reports that last month, on 3/18, patient was seen for laceration to base of left ring finger.  Patient had stitches placed, removed after 12 days.  Patient states that ever since she had these initial stitches placed on 3/18 she has had constant pain to her left ring finger.  Patient reports the pain is on both sides of the finger.  Patient states that she has not taken any medication to include ibuprofen or Tylenol.  Patient states that 3 nights ago she slammed her hand onto a cabinet and she has had increased pain since then.  Patient denies any fevers, overlying skin change to left ring finger.  She denies any numbness or tingling to the finger.  HPI     Home Medications Prior to Admission medications   Medication Sig Start Date End Date Taking? Authorizing Provider  azithromycin (ZITHROMAX) 250 MG tablet Take 1 tablet (250 mg total) by mouth daily. Take first 2 tablets together, then 1 every day until finished. 05/21/21   Terrilee Files, MD  HYDROcodone-acetaminophen (NORCO/VICODIN) 5-325 MG tablet Take 1 tablet by mouth every 6 (six) hours as needed. 03/31/16   Joy, Shawn C, PA-C  loratadine (CLARITIN) 10 MG tablet Take 1 tablet (10 mg total) by mouth daily for 10 days. 10/12/18 10/22/18  Shaune Pollack, MD  methocarbamol (ROBAXIN) 500 MG tablet Take 1 tablet (500 mg total) by mouth 2 (two) times daily. 03/31/16   Joy, Shawn C, PA-C  naproxen (NAPROSYN) 500 MG tablet Take 1 tablet (500 mg total) by mouth 2 (two) times daily. 03/31/16   Joy, Hillard Danker, PA-C  ondansetron (ZOFRAN ODT) 4 MG disintegrating tablet 4mg  ODT q4 hours prn nausea/vomit 03/08/21    Charlynne Pander, MD      Allergies    Patient has no known allergies.    Review of Systems   Review of Systems  Musculoskeletal:  Positive for arthralgias.  All other systems reviewed and are negative.   Physical Exam Updated Vital Signs BP 117/74 (BP Location: Right Arm)   Pulse 76   Temp 97.9 F (36.6 C) (Oral)   Resp 17   Ht 5\' 6"  (1.676 m)   Wt 81.6 kg   SpO2 100%   BMI 29.05 kg/m  Physical Exam Vitals and nursing note reviewed.  Constitutional:      General: She is not in acute distress.    Appearance: Normal appearance. She is not ill-appearing, toxic-appearing or diaphoretic.  HENT:     Head: Normocephalic and atraumatic.     Nose: Nose normal.     Mouth/Throat:     Mouth: Mucous membranes are moist.     Pharynx: Oropharynx is clear.  Eyes:     Extraocular Movements: Extraocular movements intact.     Conjunctiva/sclera: Conjunctivae normal.     Pupils: Pupils are equal, round, and reactive to light.  Cardiovascular:     Rate and Rhythm: Normal rate and regular rhythm.  Pulmonary:     Effort: Pulmonary effort is normal.     Breath sounds: Normal breath sounds. No wheezing.  Abdominal:  General: Abdomen is flat. Bowel sounds are normal.     Palpations: Abdomen is soft.     Tenderness: There is no abdominal tenderness.  Musculoskeletal:     Cervical back: Normal range of motion and neck supple. No tenderness.     Comments: Left ring finger with no obvious deformity, slight swelling compared to left, brisk cap refill.  Patient has no pain with passive extension.  There is no overlying skin change to include erythema.  The patient can flex and extend her finger.  Skin:    General: Skin is warm and dry.     Capillary Refill: Capillary refill takes less than 2 seconds.  Neurological:     Mental Status: She is alert and oriented to person, place, and time.     ED Results / Procedures / Treatments   Labs (all labs ordered are listed, but only  abnormal results are displayed) Labs Reviewed - No data to display  EKG None  Radiology DG Finger Ring Left  Result Date: 11/29/2022 CLINICAL DATA:  Status post trauma. EXAM: LEFT RING FINGER 2+V COMPARISON:  None Available. FINDINGS: There is an acute, nondisplaced fracture deformity involving the base of the distal phalanx of the fourth left finger. There is no evidence of dislocation. There is no evidence of arthropathy or other focal bone abnormality. Soft tissues are unremarkable. IMPRESSION: Acute fracture of the distal phalanx of the fourth left finger. Electronically Signed   By: Aram Candela M.D.   On: 11/29/2022 19:21    Procedures Procedures   Medications Ordered in ED Medications  ibuprofen (ADVIL) tablet 600 mg (600 mg Oral Given 11/29/22 1920)    ED Course/ Medical Decision Making/ A&P Clinical Course as of 11/29/22 2017  Tue Nov 29, 2022  2005 DG Finger Ring Left [CG]    Clinical Course User Index [CG] Al Decant, PA-C    Medical Decision Making  47 year old female presents to the ED for evaluation.  Please see HPI for further details.  On examination patient afebrile and nontachycardic.  Her left ring finger has no obvious deformity, there is slight swelling compared to left.  She has no pain with passive extension so I doubt flexor tenosynovitis.  She has a brisk capillary refill, she is neurovascularly intact.  She can fully range her finger however she reports that it is painful and there is slightly decreased range of motion because of this.  Plain film imaging shows acute fracture of patient fourth finger.  Patient placed in static finger splint, buddy taped.  The patient will be advised to take ibuprofen for pain at home.  We will refer her to Dr. Dion Saucier for evaluation. Patient advised to follow up with PCP in interim. All questions answered to satisfaction.  Patient stable for discharge.   Final Clinical Impression(s) / ED Diagnoses Final  diagnoses:  Nondisplaced fracture of distal phalanx of left ring finger, initial encounter for closed fracture    Rx / DC Orders ED Discharge Orders     None         Clent Ridges 11/29/22 2017    Tegeler, Canary Brim, MD 11/29/22 [phone]

## 2022-11-29 NOTE — ED Triage Notes (Signed)
C/O left ring finger pain x 16month, started after getting sutures on same side
